# Patient Record
Sex: Male | Born: 1955 | Race: White | Hispanic: No | Marital: Single | Smoking: Current every day smoker
Health system: Southern US, Community
[De-identification: ages and names within clinical notes are randomized; demographics above are authoritative.]

## PROBLEM LIST (undated history)

## (undated) DIAGNOSIS — M549 Dorsalgia, unspecified: Secondary | ICD-10-CM

## (undated) DIAGNOSIS — N200 Calculus of kidney: Secondary | ICD-10-CM

## (undated) DIAGNOSIS — J9819 Other pulmonary collapse: Secondary | ICD-10-CM

## (undated) DIAGNOSIS — K469 Unspecified abdominal hernia without obstruction or gangrene: Secondary | ICD-10-CM

## (undated) HISTORY — PX: BACK SURGERY: SHX140

---

## 1988-11-09 DIAGNOSIS — J9819 Other pulmonary collapse: Secondary | ICD-10-CM

## 1988-11-09 HISTORY — DX: Other pulmonary collapse: J98.19

## 1999-10-22 ENCOUNTER — Emergency Department (HOSPITAL_COMMUNITY): Admission: EM | Admit: 1999-10-22 | Discharge: 1999-10-22 | Payer: Self-pay | Admitting: Emergency Medicine

## 1999-11-01 ENCOUNTER — Ambulatory Visit (HOSPITAL_COMMUNITY): Admission: RE | Admit: 1999-11-01 | Discharge: 1999-11-01 | Payer: Self-pay | Admitting: *Deleted

## 1999-11-01 ENCOUNTER — Encounter: Payer: Self-pay | Admitting: *Deleted

## 1999-11-09 ENCOUNTER — Encounter: Payer: Self-pay | Admitting: *Deleted

## 1999-11-09 ENCOUNTER — Ambulatory Visit (HOSPITAL_COMMUNITY): Admission: RE | Admit: 1999-11-09 | Discharge: 1999-11-09 | Payer: Self-pay | Admitting: *Deleted

## 1999-11-24 ENCOUNTER — Encounter: Payer: Self-pay | Admitting: Neurosurgery

## 1999-11-25 ENCOUNTER — Observation Stay (HOSPITAL_COMMUNITY): Admission: RE | Admit: 1999-11-25 | Discharge: 1999-11-26 | Payer: Self-pay | Admitting: Neurosurgery

## 1999-11-25 ENCOUNTER — Encounter: Payer: Self-pay | Admitting: Neurosurgery

## 2000-06-09 ENCOUNTER — Emergency Department (HOSPITAL_COMMUNITY): Admission: EM | Admit: 2000-06-09 | Discharge: 2000-06-09 | Payer: Self-pay | Admitting: Emergency Medicine

## 2005-05-09 ENCOUNTER — Emergency Department (HOSPITAL_COMMUNITY): Admission: EM | Admit: 2005-05-09 | Discharge: 2005-05-09 | Payer: Self-pay | Admitting: Emergency Medicine

## 2008-09-25 ENCOUNTER — Ambulatory Visit: Payer: Self-pay | Admitting: Internal Medicine

## 2009-04-08 ENCOUNTER — Emergency Department (HOSPITAL_COMMUNITY): Admission: EM | Admit: 2009-04-08 | Discharge: 2009-04-08 | Payer: Self-pay | Admitting: Emergency Medicine

## 2009-10-17 IMAGING — CT CT PELVIS W/O CM
2 of 4 series · 16 of 46 positions shown, 18 images · non-contrast
Comparison: None available.

CT ABDOMEN

CLINICAL DATA: 52-year-old male with acute onset left flank pain
radiating to the groin.  Nausea and vomiting.

CT ABDOMEN AND PELVIS WITHOUT CONTRAST
TECHNIQUE: Multidetector CT imaging of the abdomen and pelvis was
performed following the standard protocol without intravenous
contrast.

[Series 2: <(id) stone a/p >(id) · axial · 0.83mm/px · z∈[-442,+4]mm · 13 of 94 slices shown, 15 images]
[im 4/94  soft-tissue]
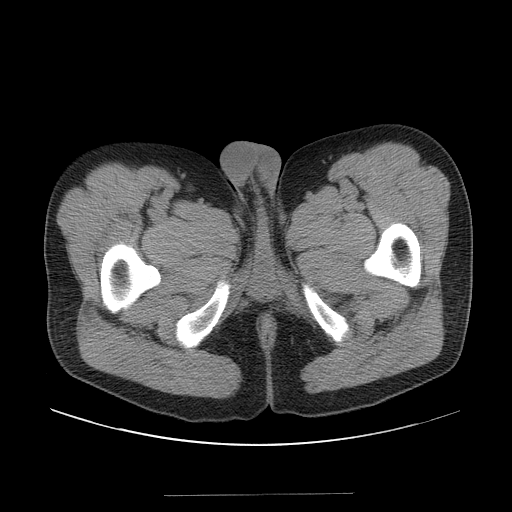
[im 4/94  bone]
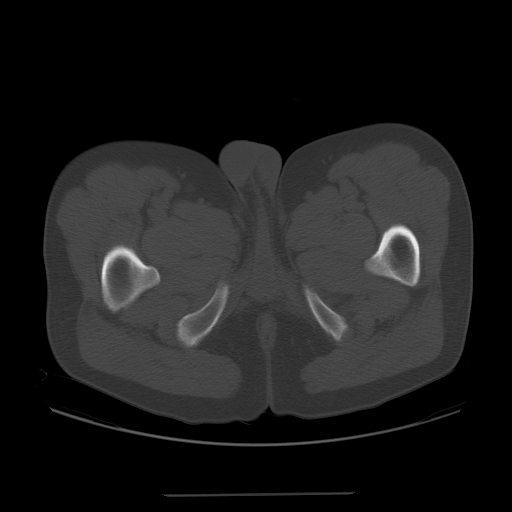
[im 12/94  soft-tissue]
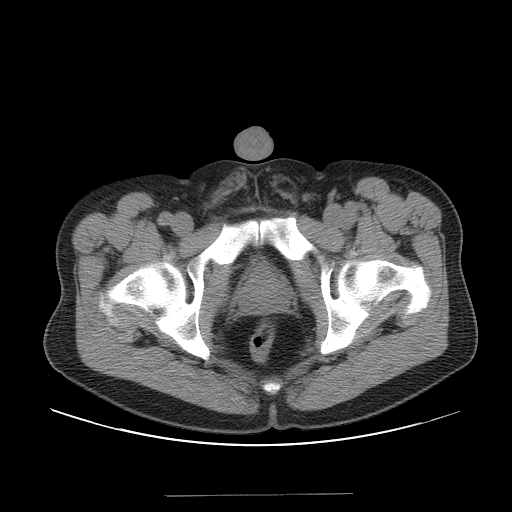
[im 20/94  soft-tissue]
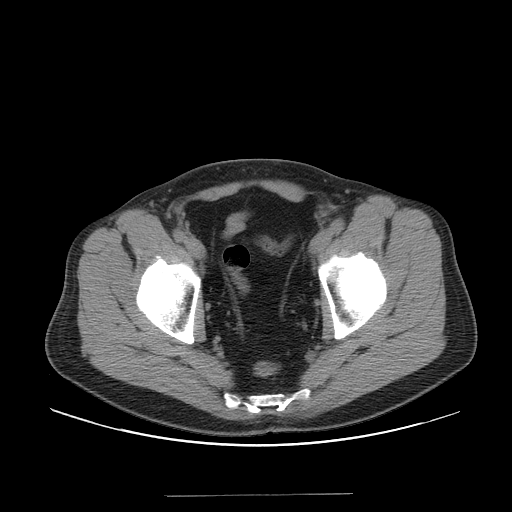
[im 28/94  soft-tissue]
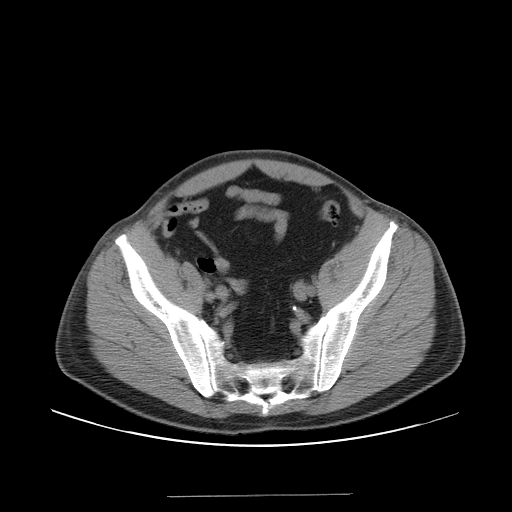
[im 32/94  soft-tissue]
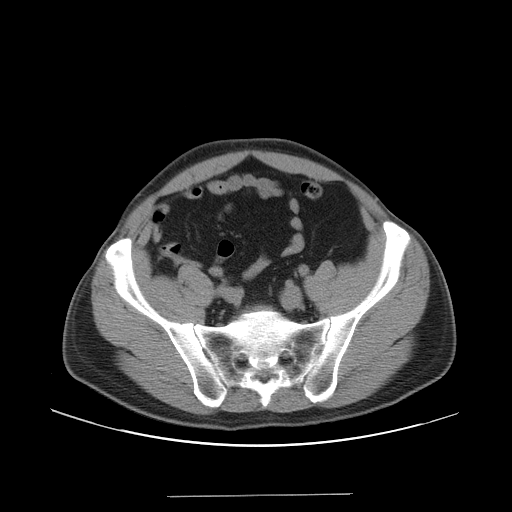
[im 39/94  soft-tissue]
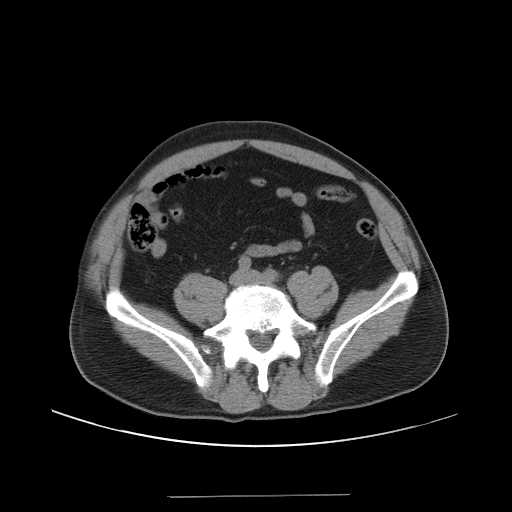
[im 47/94  soft-tissue]
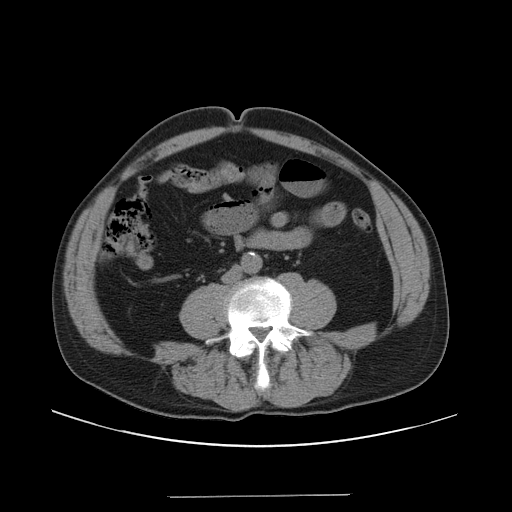
[im 55/94  soft-tissue]
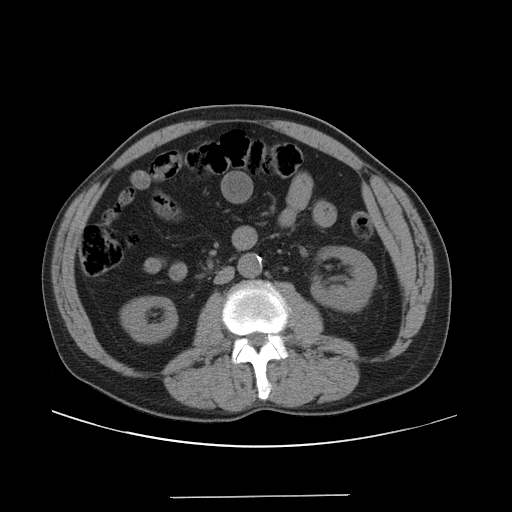
[im 63/94  soft-tissue]
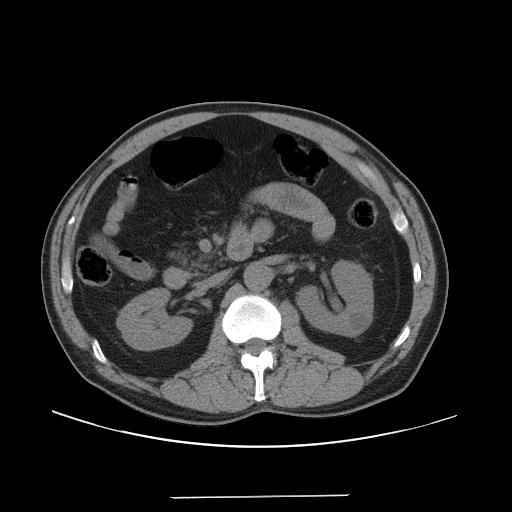
[im 63/94  bone]
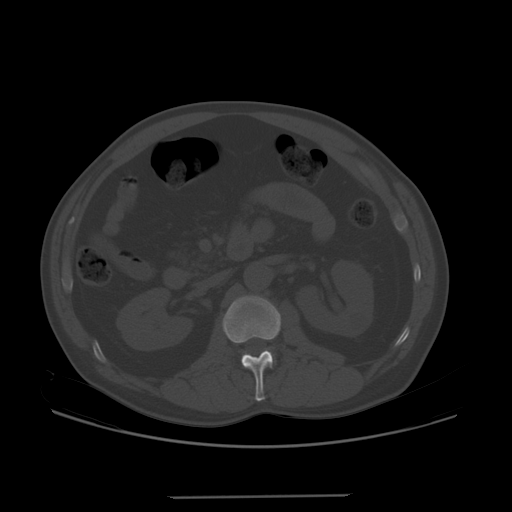
[im 66/94  soft-tissue]
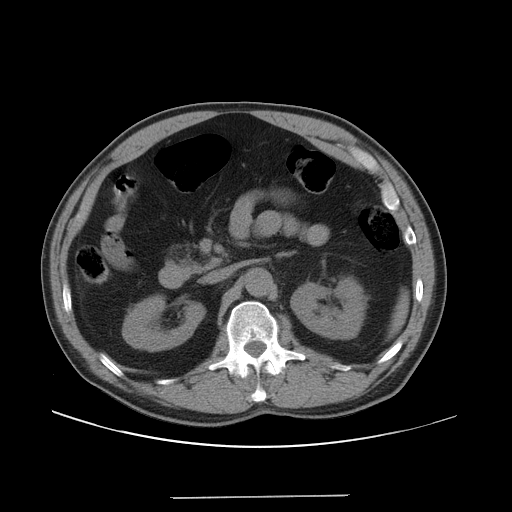
[im 74/94  soft-tissue]
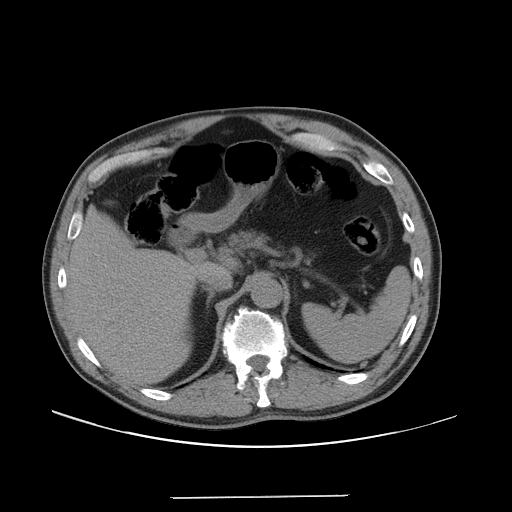
[im 82/94  soft-tissue]
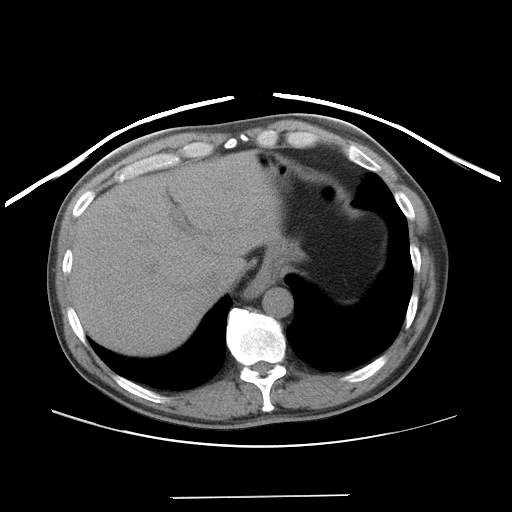
[im 90/94  soft-tissue]
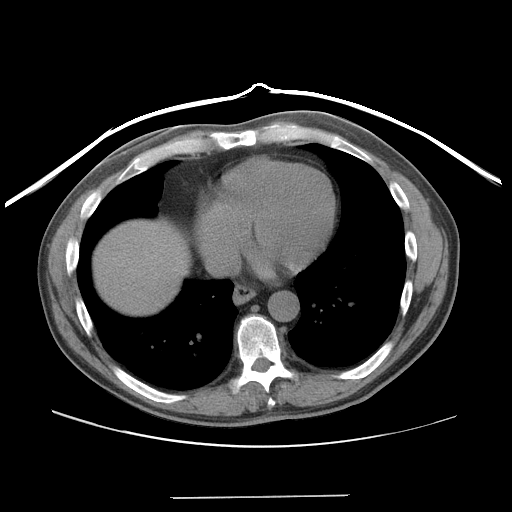

[Series 401: cor a/p · coronal · 0.90mm/px · 3 of 83 slices shown]
[im 28/83  soft-tissue]
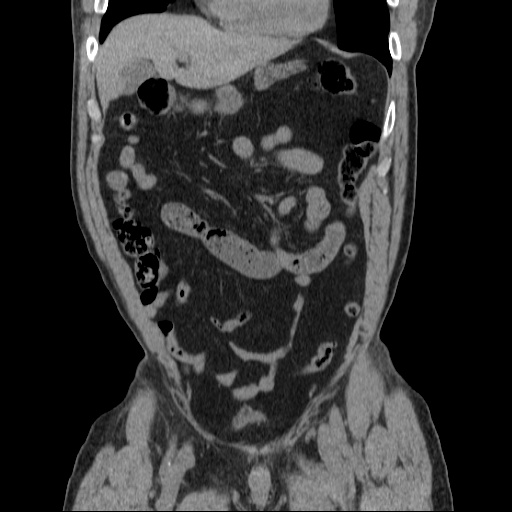
[im 37/83  soft-tissue]
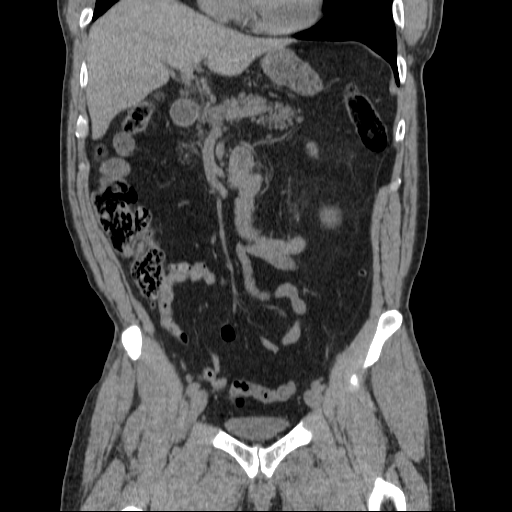
[im 46/83  soft-tissue]
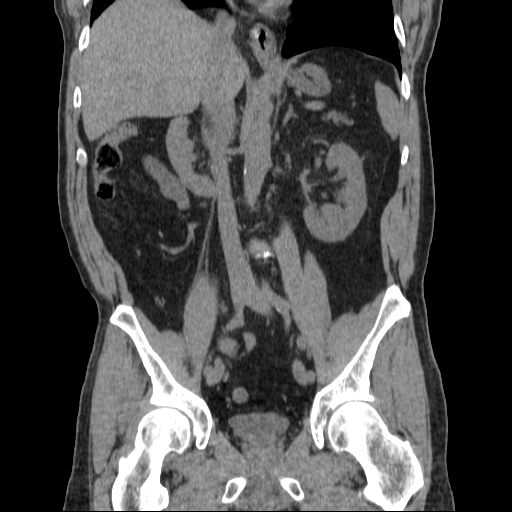

[16 of 46 positions shown; findings below may reference images not displayed]

FINDINGS: Mild atelectasis at the lung bases.  Degenerative
changes in the spine. No acute osseous abnormality identified.
Visualized noncontrast liver, gallbladder, spleen, pancreas, and
adrenal glands are within normal limits.  Small sliding-type hiatal
hernia suspected.  Otherwise, noncontrasted stomach, duodenum,
proximal small bowel loops, visualized distal small bowel loops,
and appendix are normal.  Occasional colonic diverticula, otherwise
the visualized colon is within normal limits.  No free fluid.

The right kidney and proximal right ureter are normal.

There is left nephromegaly, perinephric stranding, and moderate
left hydronephrosis.  There is a proximal obstructing left ureteral
calculus measuring 2 x 4 mm and located just distal to the left
ureteropelvic junction.  No other left nephrolithiasis.  Beyond
this level of the left ureter is decompressed and normal.  There is
a 2.5 cm diameter hypodense lesion of the left renal mid pole.
This is incompletely characterized in the absence of contrast, but
densitometry suggests a simple cyst.
IMPRESSION: 1.  Acute obstructive uropathy on the left due to a 2 x 4 mm
calculus just distal to the left UPJ.
2.  2.5 cm left renal mid pole hypodense lesion is incompletely
characterized, but a simple cyst is favored.

CT PELVIS
FINDINGS: No pelvic free fluid.  Visualized distal large and small
bowel loops are within normal limits aside from occasional colonic
diverticula.  Bladder is decompressed.  Multiple pelvic
phleboliths. No acute osseous abnormality identified.
IMPRESSION: No acute findings in the pelvis.

## 2011-02-17 LAB — URINALYSIS, ROUTINE W REFLEX MICROSCOPIC
Glucose, UA: NEGATIVE mg/dL
Ketones, ur: NEGATIVE mg/dL
Protein, ur: NEGATIVE mg/dL
pH: 6.5 (ref 5.0–8.0)

## 2011-02-17 LAB — URINE MICROSCOPIC-ADD ON
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
## 2013-11-09 DIAGNOSIS — K4090 Unilateral inguinal hernia, without obstruction or gangrene, not specified as recurrent: Secondary | ICD-10-CM

## 2013-11-09 HISTORY — DX: Unilateral inguinal hernia, without obstruction or gangrene, not specified as recurrent: K40.90

## 2013-11-13 ENCOUNTER — Encounter (HOSPITAL_COMMUNITY): Payer: Self-pay | Admitting: Emergency Medicine

## 2013-11-13 ENCOUNTER — Emergency Department (HOSPITAL_COMMUNITY): Payer: Self-pay

## 2013-11-13 ENCOUNTER — Emergency Department (HOSPITAL_COMMUNITY)
Admission: EM | Admit: 2013-11-13 | Discharge: 2013-11-13 | Disposition: A | Discharge status: Home / Self Care | Payer: Self-pay | Attending: Emergency Medicine | Admitting: Emergency Medicine

## 2013-11-13 DIAGNOSIS — Z8719 Personal history of other diseases of the digestive system: Secondary | ICD-10-CM | POA: Insufficient documentation

## 2013-11-13 DIAGNOSIS — R0789 Other chest pain: Secondary | ICD-10-CM

## 2013-11-13 DIAGNOSIS — F17200 Nicotine dependence, unspecified, uncomplicated: Secondary | ICD-10-CM | POA: Insufficient documentation

## 2013-11-13 DIAGNOSIS — Z79899 Other long term (current) drug therapy: Secondary | ICD-10-CM | POA: Insufficient documentation

## 2013-11-13 DIAGNOSIS — R071 Chest pain on breathing: Secondary | ICD-10-CM | POA: Insufficient documentation

## 2013-11-13 DIAGNOSIS — Z8709 Personal history of other diseases of the respiratory system: Secondary | ICD-10-CM | POA: Insufficient documentation

## 2013-11-13 DIAGNOSIS — IMO0001 Reserved for inherently not codable concepts without codable children: Secondary | ICD-10-CM | POA: Insufficient documentation

## 2013-11-13 HISTORY — DX: Unspecified abdominal hernia without obstruction or gangrene: K46.9

## 2013-11-13 HISTORY — DX: Other pulmonary collapse: J98.19

## 2013-11-13 LAB — URINALYSIS W MICROSCOPIC + REFLEX CULTURE
Bilirubin Urine: NEGATIVE
Glucose, UA: NEGATIVE mg/dL
Hgb urine dipstick: NEGATIVE
Ketones, ur: NEGATIVE mg/dL
LEUKOCYTES UA: NEGATIVE
NITRITE: NEGATIVE
PH: 7 (ref 5.0–8.0)
Protein, ur: NEGATIVE mg/dL
SPECIFIC GRAVITY, URINE: 1.012 (ref 1.005–1.030)
UROBILINOGEN UA: 0.2 mg/dL (ref 0.0–1.0)

## 2013-11-13 LAB — COMPREHENSIVE METABOLIC PANEL
ALT: 12 U/L (ref 0–53)
AST: 18 U/L (ref 0–37)
Albumin: 3.6 g/dL (ref 3.5–5.2)
Alkaline Phosphatase: 51 U/L (ref 39–117)
BUN: 11 mg/dL (ref 6–23)
CALCIUM: 9.4 mg/dL (ref 8.4–10.5)
CO2: 26 mEq/L (ref 19–32)
CREATININE: 0.73 mg/dL (ref 0.50–1.35)
Chloride: 96 mEq/L (ref 96–112)
GFR calc non Af Amer: >90 mL/min (ref >90)
GLUCOSE: 91 mg/dL (ref 70–99)
Potassium: 4.2 mEq/L (ref 3.7–5.3)
SODIUM: 134 meq/L — AB (ref 137–147)
TOTAL PROTEIN: 7.1 g/dL (ref 6.0–8.3)
Total Bilirubin: 0.6 mg/dL (ref 0.3–1.2)

## 2013-11-13 LAB — D-DIMER, QUANTITATIVE: D-Dimer, Quant: <0.27 ug/mL-FEU (ref 0.00–0.48)

## 2013-11-13 LAB — POCT I-STAT TROPONIN I: Troponin i, poc: 0.01 ng/mL (ref 0.00–0.08)

## 2013-11-13 MED ORDER — IBUPROFEN 600 MG PO TABS: 600.0000 mg | ORAL_TABLET | Freq: Four times a day (QID) | ORAL | Status: AC | PRN

## 2013-11-13 MED ORDER — HYDROCODONE-ACETAMINOPHEN 5-325 MG PO TABS
1.0000 | ORAL_TABLET | Freq: Once | ORAL | Status: AC
  Administered 2013-11-13: 1 via ORAL
  Filled 2013-11-13: qty 1

## 2013-11-13 MED ORDER — HYDROCODONE-ACETAMINOPHEN 5-325 MG PO TABS: 1.0000 | ORAL_TABLET | Freq: Four times a day (QID) | ORAL | Status: AC | PRN

## 2013-11-13 MED ORDER — DIAZEPAM 5 MG PO TABS
5.0000 mg | ORAL_TABLET | Freq: Once | ORAL | Status: AC
  Administered 2013-11-13: 5 mg via ORAL
  Filled 2013-11-13: qty 1

## 2013-11-13 MED ORDER — DIAZEPAM 5 MG PO TABS: 5.0000 mg | ORAL_TABLET | Freq: Three times a day (TID) | ORAL | Status: AC | PRN

## 2013-11-13 NOTE — Discharge Instructions (Signed)
Your work up today did not show any problems with your heart or lungs. It is most likely musculoskeletal. Take ibuprofen for pain. Take valium for spams, norco for severe pain. Rest. Try heating pads. Follow up with your doctor.    Chest Wall Pain Chest wall pain is pain in or around the bones and muscles of your chest. It may take up to 6 weeks to get better. It may take longer if you must stay physically active in your work and activities.  CAUSES  Chest wall pain may happen on its own. However, it may be caused by:  A viral illness like the flu.  Injury.  Coughing.  Exercise.  Arthritis.  Fibromyalgia.  Shingles. HOME CARE INSTRUCTIONS   Avoid overtiring physical activity. Try not to strain or perform activities that cause pain. This includes any activities using your chest or your abdominal and side muscles, especially if heavy weights are used.  Put ice on the sore area.  Put ice in a plastic bag.  Place a towel between your skin and the bag.  Leave the ice on for 15-20 minutes per hour while awake for the first 2 days.  Only take over-the-counter or prescription medicines for pain, discomfort, or fever as directed by your caregiver. SEEK IMMEDIATE MEDICAL CARE IF:   Your pain increases, or you are very uncomfortable.  You have a fever.  Your chest pain becomes worse.  You have new, unexplained symptoms.  You have nausea or vomiting.  You feel sweaty or lightheaded.  You have a cough with phlegm (sputum), or you cough up blood. MAKE SURE YOU:   Understand these instructions.  Will watch your condition.  Will get help right away if you are not doing well or get worse. Document Released: 10/26/2005 Document Revised: 01/18/2012 Document Reviewed: 06/22/2011 [facility] Patient Information 2014 [place], [place].

## 2013-11-13 NOTE — ED Notes (Signed)
Pt c/o rt sided chest pain radiating through rt shoulder/rt neck since last pm, states hx of collapsed lung and this feels the same

## 2013-11-13 NOTE — Progress Notes (Signed)
[facility] provided pt with a list of primary care resources. Patient stated that he was pending [employer] insurance on 2/1.

## 2013-11-13 NOTE — ED Provider Notes (Signed)
CSN: [id number]     Arrival date & time 11/13/13  1531 History   First MD Initiated Contact with Patient 11/13/13 1833     Chief Complaint  Patient presents with  . Chest Pain  . Shortness of Breath   (Consider location/radiation/quality/duration/timing/severity/associated sxs/prior Treatment) HPI Cole Coleman is a 58 y.o. male who presents emergency department complaining of right chest pain and right shoulder pain. Patient states that his pain began 3 days ago. Patient states that his pain is on the right side, feels like deepened along, states worsened with movement of the body and bowel movement of the right arm. He denies any pain with deep breathing. He denies any cough or pain with coughing. He denies any shortness of breath. He denies any fever or chills. Denies any injuries. He states he has history of collapsed lung and states this feels the same. Patient took ibuprofen today for his pain which didn't help his symptoms but states his pain is still 8/10.  Past Medical History  Diagnosis Date  . Collapsed lung   . Hernia    Past Surgical History  Procedure Laterality Date  . Back surgery     No family history on file. History  Substance Use Topics  . Smoking status: Current Every Day Smoker  . Smokeless tobacco: Not on file  . Alcohol Use: No    Review of Systems  Constitutional: Negative for fever and chills.  Respiratory: Negative for cough, chest tightness and shortness of breath.   Cardiovascular: Positive for chest pain. Negative for palpitations and leg swelling.  Gastrointestinal: Negative for nausea, vomiting, abdominal pain, diarrhea and abdominal distention.  Genitourinary: Negative for dysuria, urgency, frequency and hematuria.  Musculoskeletal: Positive for myalgias. Negative for neck pain and neck stiffness.  Skin: Negative for rash.  Allergic/Immunologic: Negative for immunocompromised state.  Neurological: Negative for dizziness, weakness and numbness.     Allergies  Codeine  Home Medications   Current Outpatient Rx  Name  Route  Sig  Dispense  Refill  . ibuprofen (ADVIL,MOTRIN) 200 MG tablet   Oral   Take 400 mg by mouth every 6 (six) hours as needed.         . meloxicam (MOBIC) 15 MG tablet   Oral   Take 15 mg by mouth daily.          BP 117/66  Pulse 82  Temp(Src) 98.6 F (37 C) (Oral)  Resp 16  SpO2 97% Physical Exam  Nursing note and vitals reviewed. Constitutional: He appears well-developed and well-nourished. No distress.  HENT:  Head: Normocephalic.  Eyes: Conjunctivae are normal.  Neck: Normal range of motion. Neck supple.  Cardiovascular: Normal rate, regular rhythm and normal heart sounds.   Pulmonary/Chest: Effort normal and breath sounds normal. No respiratory distress. He has no wheezes. He has no rales. He exhibits tenderness.  Tender over anterior pectoralis muscle, mid axillary ribs, posterior periscapular muscles.   Abdominal: Soft. Bowel sounds are normal. There is no tenderness.  Musculoskeletal: He exhibits no edema.  Neurological: He is alert.  Skin: Skin is warm and dry.    ED Course  Procedures (including critical care time) Labs Review Labs Reviewed  CBC WITH DIFFERENTIAL - Abnormal; Notable for the following:    WBC 12.4 (*)    All other components within normal limits  COMPREHENSIVE METABOLIC PANEL - Abnormal; Notable for the following:    Sodium 134 (*)    All other components within normal limits  URINALYSIS W  MICROSCOPIC + REFLEX CULTURE  D-DIMER, QUANTITATIVE  POCT I-STAT TROPONIN I   Imaging Review Dg Chest 2 View (if Patient Has Fever And/or Copd)  11/13/2013   CLINICAL DATA:  Shortness of breath.  Right side chest pain.  EXAM: CHEST  2 VIEW  COMPARISON:  None.  FINDINGS: The lungs are clear. Heart size is normal. No pneumothorax or pleural effusion. No focal bony abnormality is identified.  IMPRESSION: No acute disease.   Electronically Signed   By: Drusilla Kanner M.D.    On: 11/13/2013 17:28    EKG Interpretation    Date/Time:  Monday November 13 2013 15:52:15 EST Ventricular Rate:  80 PR Interval:  128 QRS Duration: 115 QT Interval:  364 QTC Calculation: 420 R Axis:   74 Text Interpretation:  Sinus rhythm Ventricular premature complex Nonspecific intraventricular conduction delay No significant change since last tracing Confirmed by HARRISON  MD, FORREST ([id number]) on 11/13/2013 9:59:04 PM            MDM   1. Chest wall pain    and  Patient with right sided chest and shoulder pain onset yesterday. Pain is reproducible with movement of the right arm and palpation of the right side. There is no rash over the area. Chest x-Lonigro and labs are unremarkable. EKG is normal. Given findings, history, exam, suspect chest wall pain. Patient does lift heavy loads at work. I discussed with Dr. Romeo Apple who has seen patient and increase. Will discharge home with muscle relaxants and pain medication. Followup with primary care Dr. Gaylyn Rong signs are completely normal. Patient is nontoxic and is ready for discharge home.  Filed Vitals:   11/13/13 1545 11/13/13 2251  BP: 117/66 123/76  Pulse: 82 71  Temp: 98.6 F (37 C)   TempSrc: Oral   Resp: 16 16  SpO2: 97% 98%     Cole Judice A Toula Miyasaki, PA-C 11/14/13 0200

## 2013-11-14 LAB — CBC WITH DIFFERENTIAL/PLATELET
BASOS PCT: 0 % (ref 0–1)
Basophils Absolute: 0 10*3/uL (ref 0.0–0.1)
EOS ABS: 0.1 10*3/uL (ref 0.0–0.7)
Eosinophils Relative: 1 % (ref 0–5)
HCT: 43.8 % (ref 39.0–52.0)
HEMOGLOBIN: 14.7 g/dL (ref 13.0–17.0)
LYMPHS ABS: 2.7 10*3/uL (ref 0.7–4.0)
Lymphocytes Relative: 22 % (ref 12–46)
MCH: 30.6 pg (ref 26.0–34.0)
MCHC: 33.6 g/dL (ref 30.0–36.0)
MCV: 91.3 fL (ref 78.0–100.0)
MONO ABS: 0.9 10*3/uL (ref 0.1–1.0)
Monocytes Relative: 7 % (ref 3–12)
NEUTROS ABS: 8.7 10*3/uL — AB (ref 1.7–7.7)
NEUTROS PCT: 70 % (ref 43–77)
Platelets: 260 10*3/uL (ref 150–400)
RBC: 4.8 MIL/uL (ref 4.22–5.81)
RDW: 13 % (ref 11.5–15.5)
WBC: 12.4 10*3/uL — ABNORMAL HIGH (ref 4.0–10.5)

## 2013-11-14 NOTE — ED Provider Notes (Signed)
Medical screening examination/treatment/procedure(s) were conducted as a shared visit with non-physician practitioner(s) and myself.  I personally evaluated the patient during the encounter.  EKG Interpretation    Date/Time:  Monday November 13 2013 15:52:15 EST Ventricular Rate:  80 PR Interval:  128 QRS Duration: 115 QT Interval:  364 QTC Calculation: 420 R Axis:   74 Text Interpretation:  Sinus rhythm Ventricular premature complex Nonspecific intraventricular conduction delay No significant change since last tracing Confirmed by Tenessa Marsee  MD, Wasim Hurlbut ([id number]) on 11/13/2013 9:59:04 PM           I interviewed and examined the patient. Lungs are CTAB. Cardiac exam wnl. Abdomen soft. Pain w/ active rom of right shoulder. Pain easily reproduced on exam. Unlikely a cardiac source given good history for shoulder strain. Pt feeling better w/ norco. Labs/imaging otherwise non-contrib. Will rec d/c home and f/u w/ ortho if sx persist.   Junius ArgyleForrest S Aquarius Latouche, MD 11/14/13 1143

## 2014-05-24 IMAGING — CR DG CHEST 2V
2 series · 2 of 2 positions shown · non-contrast
Comparison: None.

CLINICAL DATA: Shortness of breath.  Right side chest pain.

EXAM:
CHEST  2 VIEW

[w chest pa]
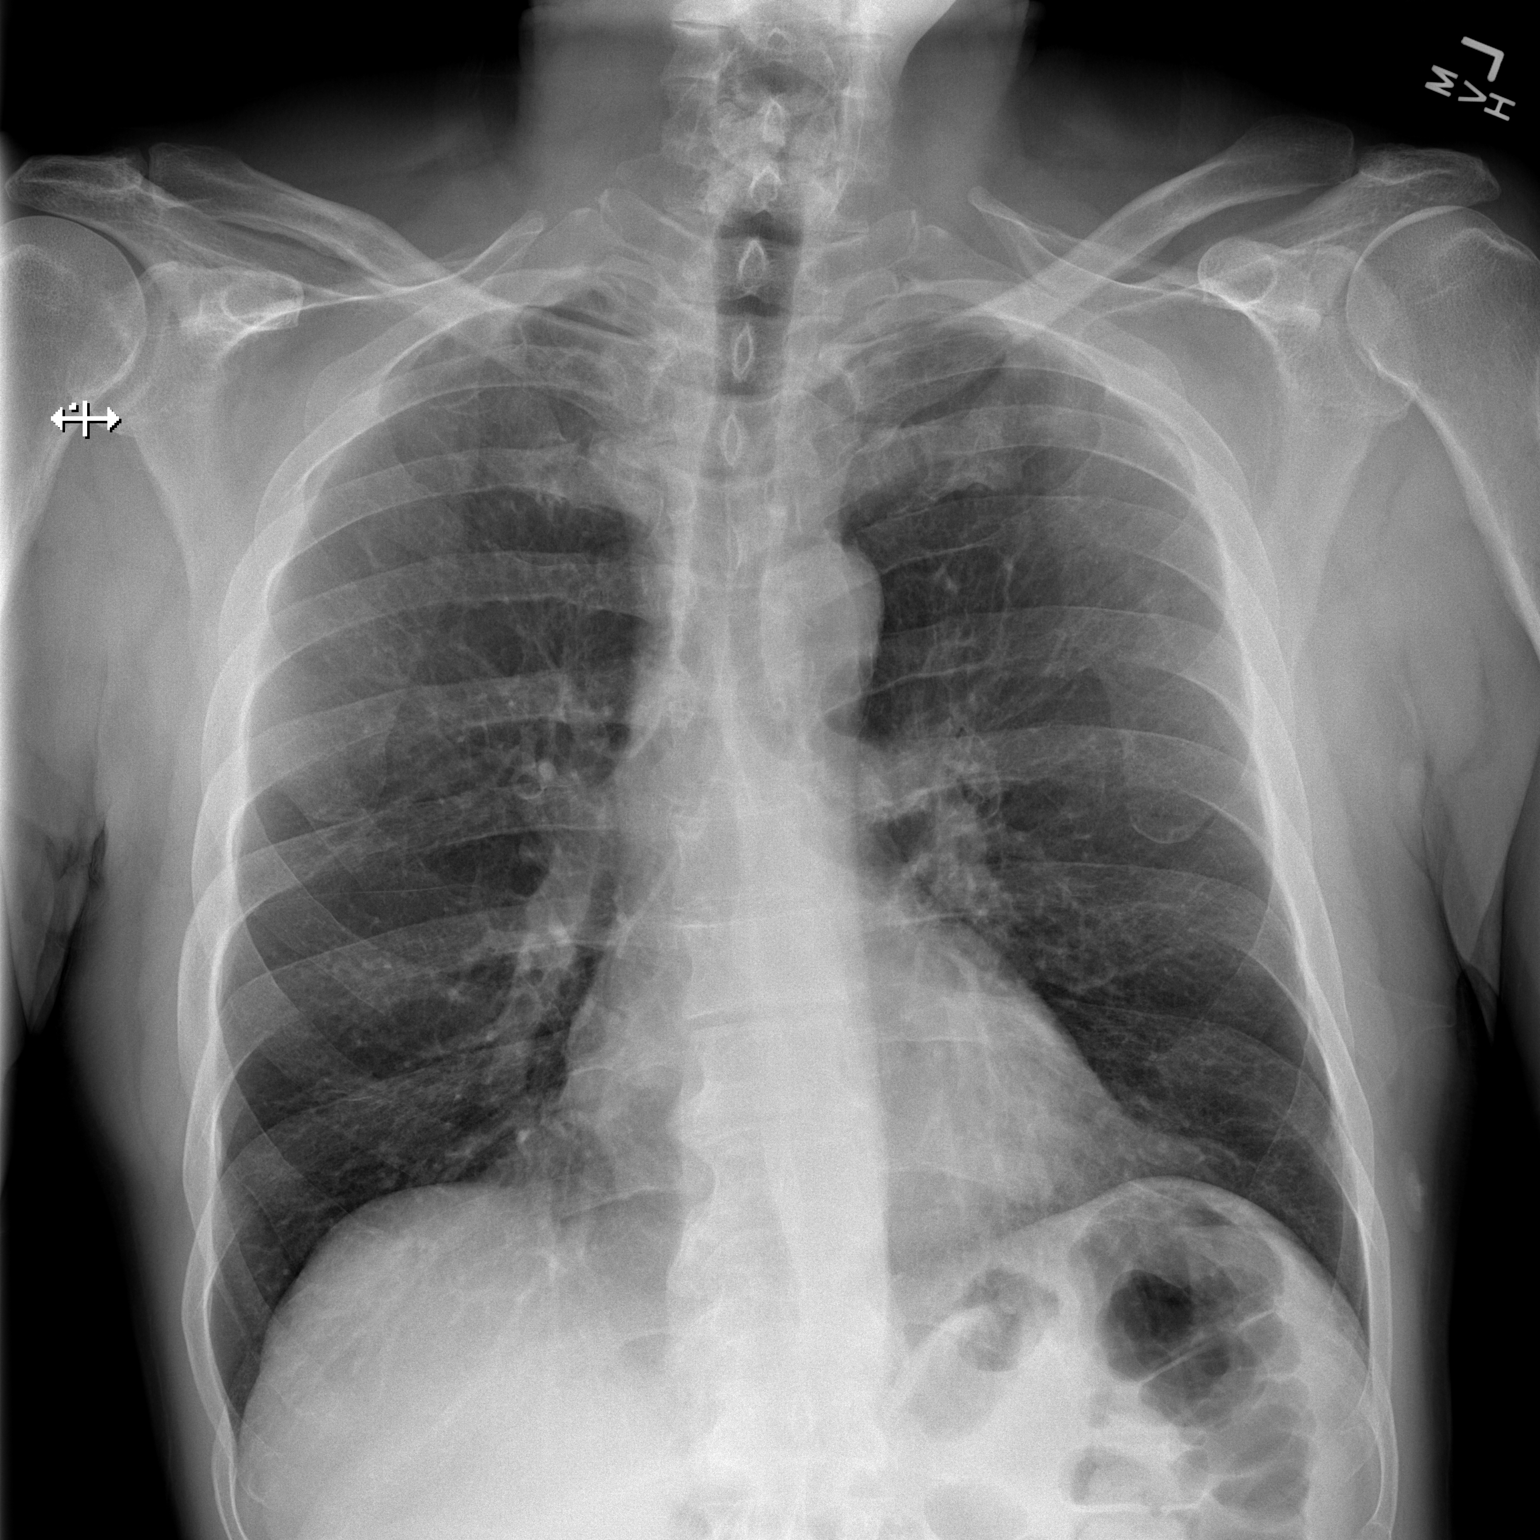

[w chest lat]
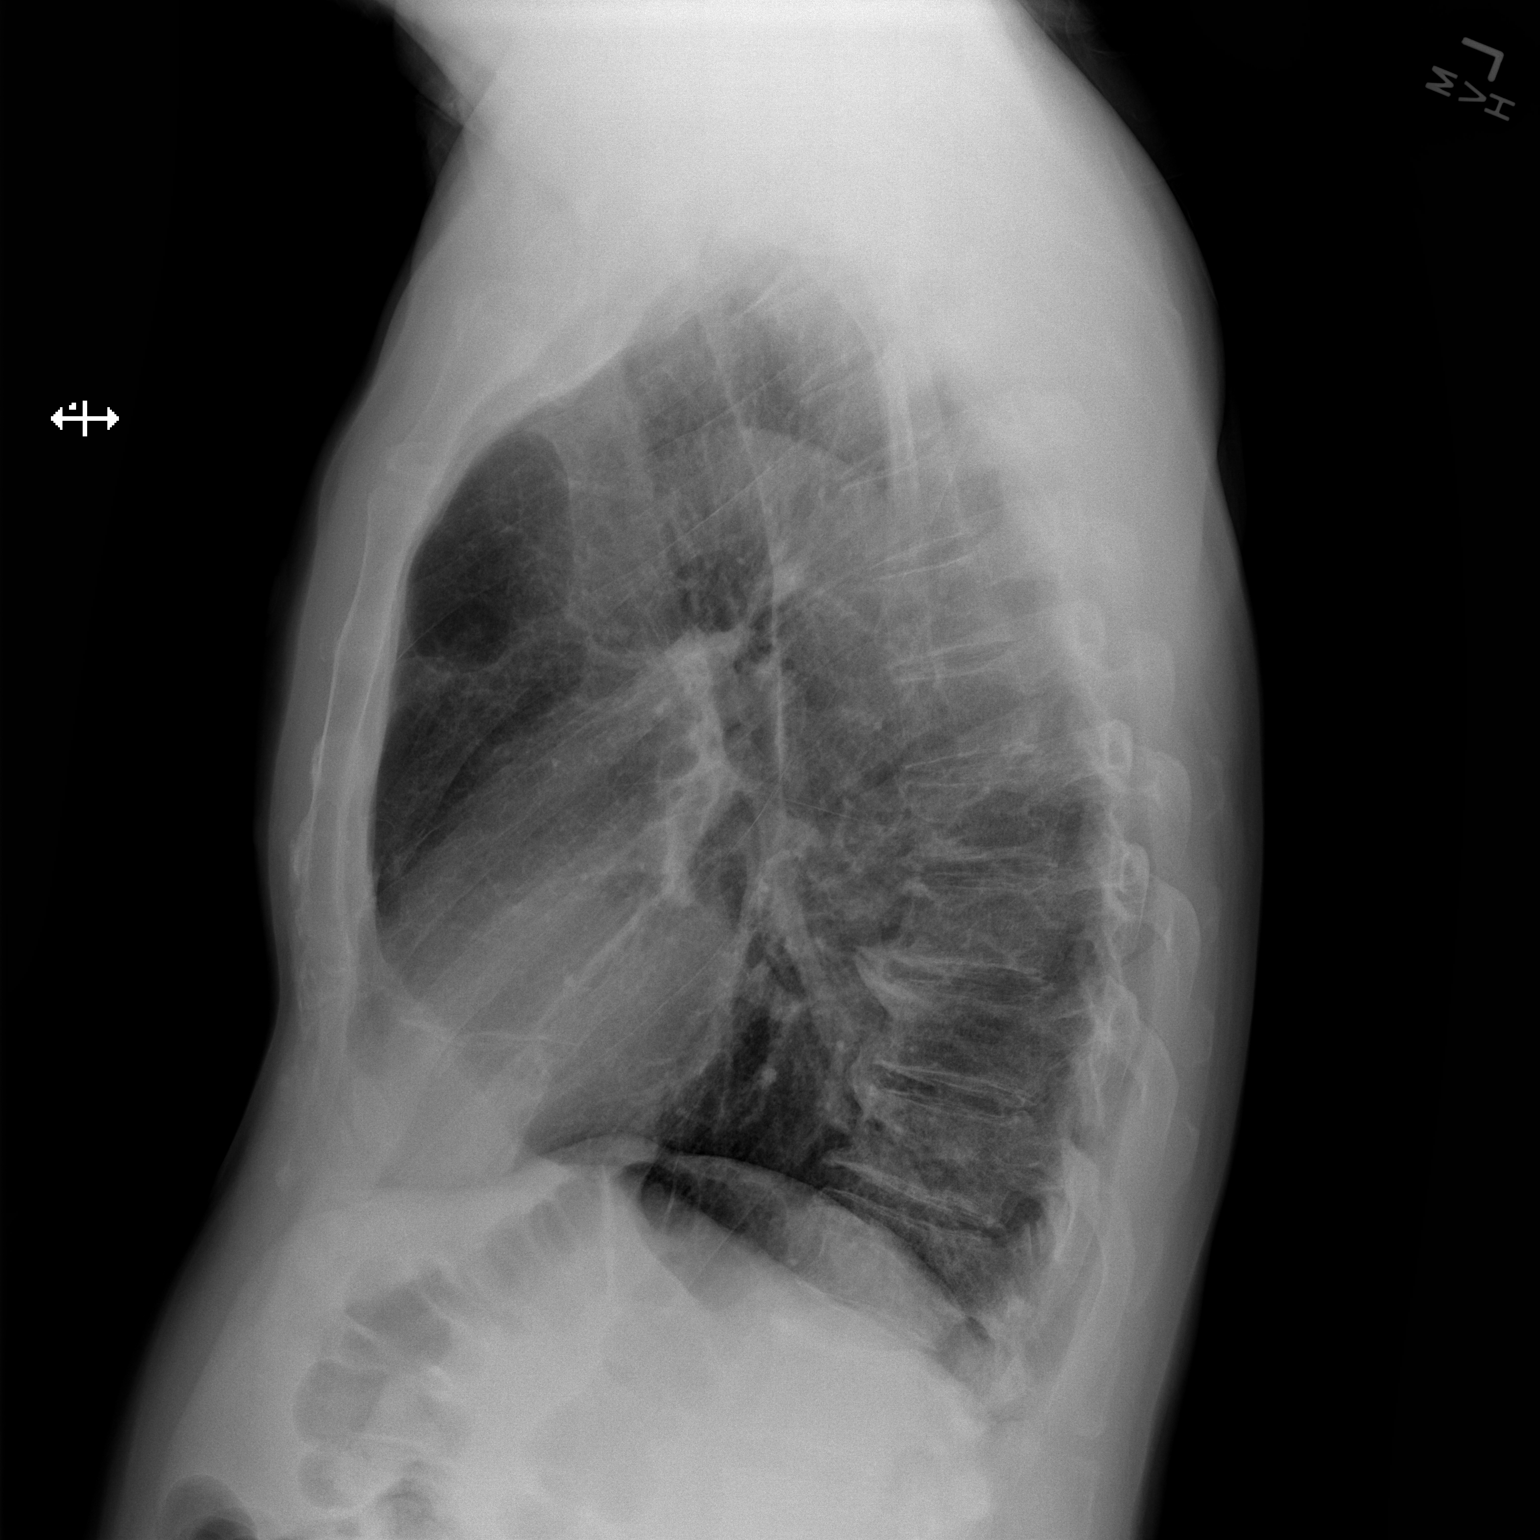

[2 of 2 positions shown; findings below may reference images not displayed]

FINDINGS: The lungs are clear. Heart size is normal. No pneumothorax or
pleural effusion. No focal bony abnormality is identified.
IMPRESSION: No acute disease.

## 2015-01-07 ENCOUNTER — Encounter (HOSPITAL_COMMUNITY): Payer: Self-pay | Admitting: Emergency Medicine

## 2015-01-07 ENCOUNTER — Emergency Department (HOSPITAL_COMMUNITY): Payer: Self-pay

## 2015-01-07 ENCOUNTER — Emergency Department (HOSPITAL_COMMUNITY)
Admission: EM | Admit: 2015-01-07 | Discharge: 2015-01-08 | Disposition: A | Discharge status: Home / Self Care | Payer: Self-pay | Attending: Emergency Medicine | Admitting: Emergency Medicine

## 2015-01-07 DIAGNOSIS — Z79899 Other long term (current) drug therapy: Secondary | ICD-10-CM | POA: Insufficient documentation

## 2015-01-07 DIAGNOSIS — Z87442 Personal history of urinary calculi: Secondary | ICD-10-CM

## 2015-01-07 DIAGNOSIS — Z791 Long term (current) use of non-steroidal anti-inflammatories (NSAID): Secondary | ICD-10-CM | POA: Insufficient documentation

## 2015-01-07 DIAGNOSIS — N200 Calculus of kidney: Secondary | ICD-10-CM | POA: Insufficient documentation

## 2015-01-07 DIAGNOSIS — Z87891 Personal history of nicotine dependence: Secondary | ICD-10-CM | POA: Insufficient documentation

## 2015-01-07 DIAGNOSIS — Z8719 Personal history of other diseases of the digestive system: Secondary | ICD-10-CM | POA: Insufficient documentation

## 2015-01-07 DIAGNOSIS — Z8709 Personal history of other diseases of the respiratory system: Secondary | ICD-10-CM | POA: Insufficient documentation

## 2015-01-07 HISTORY — DX: Calculus of kidney: N20.0

## 2015-01-07 LAB — COMPREHENSIVE METABOLIC PANEL
ALT: 18 U/L (ref 0–53)
AST: 27 U/L (ref 0–37)
Albumin: 4.3 g/dL (ref 3.5–5.2)
Alkaline Phosphatase: 49 U/L (ref 39–117)
Anion gap: 10 (ref 5–15)
BILIRUBIN TOTAL: 1 mg/dL (ref 0.3–1.2)
BUN: 16 mg/dL (ref 6–23)
CHLORIDE: 100 mmol/L (ref 96–112)
CO2: 25 mmol/L (ref 19–32)
Calcium: 9 mg/dL (ref 8.4–10.5)
Creatinine, Ser: 0.86 mg/dL (ref 0.50–1.35)
GFR calc Af Amer: >90 mL/min (ref >90)
Glucose, Bld: 118 mg/dL — ABNORMAL HIGH (ref 70–99)
POTASSIUM: 3.5 mmol/L (ref 3.5–5.1)
SODIUM: 135 mmol/L (ref 135–145)
Total Protein: 7.4 g/dL (ref 6.0–8.3)

## 2015-01-07 LAB — CBC WITH DIFFERENTIAL/PLATELET
BASOS ABS: 0 10*3/uL (ref 0.0–0.1)
Basophils Relative: 0 % (ref 0–1)
Eosinophils Absolute: 0 10*3/uL (ref 0.0–0.7)
Eosinophils Relative: 0 % (ref 0–5)
HEMATOCRIT: 42.4 % (ref 39.0–52.0)
HEMOGLOBIN: 14.1 g/dL (ref 13.0–17.0)
LYMPHS ABS: 1.4 10*3/uL (ref 0.7–4.0)
LYMPHS PCT: 11 % — AB (ref 12–46)
MCH: 30.1 pg (ref 26.0–34.0)
MCHC: 33.3 g/dL (ref 30.0–36.0)
MCV: 90.4 fL (ref 78.0–100.0)
MONO ABS: 0.6 10*3/uL (ref 0.1–1.0)
MONOS PCT: 5 % (ref 3–12)
NEUTROS ABS: 11.1 10*3/uL — AB (ref 1.7–7.7)
Neutrophils Relative %: 84 % — ABNORMAL HIGH (ref 43–77)
Platelets: 243 10*3/uL (ref 150–400)
RBC: 4.69 MIL/uL (ref 4.22–5.81)
RDW: 12.7 % (ref 11.5–15.5)
WBC: 13.1 10*3/uL — AB (ref 4.0–10.5)

## 2015-01-07 LAB — URINALYSIS, ROUTINE W REFLEX MICROSCOPIC
BILIRUBIN URINE: NEGATIVE
Glucose, UA: NEGATIVE mg/dL
KETONES UR: 15 mg/dL — AB
Leukocytes, UA: NEGATIVE
NITRITE: NEGATIVE
PROTEIN: NEGATIVE mg/dL
Specific Gravity, Urine: 1.015 (ref 1.005–1.030)
Urobilinogen, UA: 1 mg/dL (ref 0.0–1.0)
pH: 8 (ref 5.0–8.0)

## 2015-01-07 LAB — URINE MICROSCOPIC-ADD ON

## 2015-01-07 MED ORDER — ONDANSETRON HCL 4 MG/2ML IJ SOLN
4.0000 mg | Freq: Once | INTRAMUSCULAR | Status: AC
  Administered 2015-01-07: 4 mg via INTRAVENOUS
  Filled 2015-01-07: qty 2

## 2015-01-07 MED ORDER — SODIUM CHLORIDE 0.9 % IV BOLUS (SEPSIS)
500.0000 mL | Freq: Once | INTRAVENOUS | Status: AC
Start: 2015-01-07 — End: 2015-01-07
  Administered 2015-01-07: 500 mL via INTRAVENOUS

## 2015-01-07 MED ORDER — HYDROMORPHONE HCL 1 MG/ML IJ SOLN
1.0000 mg | Freq: Once | INTRAMUSCULAR | Status: AC
  Administered 2015-01-07: 1 mg via INTRAVENOUS
  Filled 2015-01-07: qty 1

## 2015-01-07 NOTE — ED Notes (Signed)
Patient aware ua sample is needed. Unable to provide at this time. Urinal @ bedside.

## 2015-01-07 NOTE — ED Provider Notes (Signed)
CSN: [id number]     Arrival date & time 01/07/15  2035 History   First MD Initiated Contact with Patient 01/07/15 2110     Chief Complaint  Patient presents with  . Flank Pain     (Consider location/radiation/quality/duration/timing/severity/associated sxs/prior Treatment) HPI   PCP: Default, Provider, MD Blood pressure 105/67, pulse 70, temperature 98.4 F (36.9 C), temperature source Oral, resp. rate 18, SpO2 95 %.  Cole Coleman is a 59 y.o.male with a significant PMH of collapsed lung, hernia, kidney stones presents to the ER with complaints of acute onset of right flank pain at 6:30 pm that radiating down into his right testicle. This feels like his previous kidney stones but not as severe.The pain was so severe he had multiple episodes of vomiting and nausea.  Negative Review of Symptoms:   He denies dysuria or hematuria. He denies chest pain, SOB, diarrhea, fevers, weakness.   Past Medical History  Diagnosis Date  . Collapsed lung   . Hernia   . Kidney stones    Past Surgical History  Procedure Laterality Date  . Back surgery     History reviewed. No pertinent family history. History  Substance Use Topics  . Smoking status: Former Games developermoker  . Smokeless tobacco: Not on file  . Alcohol Use: No    Review of Systems  10 Systems reviewed and are negative for acute change except as noted in the HPI.   Allergies  Codeine  Home Medications   Prior to Admission medications   Medication Sig Start Date End Date Taking? Authorizing Provider  Multiple Vitamin (MULTIVITAMIN WITH MINERALS) TABS tablet Take 1 tablet by mouth daily.   Yes Historical Provider, MD  omega-3 acid ethyl esters (LOVAZA) 1 G capsule Take 1 g by mouth 2 (two) times daily.   Yes Historical Provider, MD  vitamin C (ASCORBIC ACID) 500 MG tablet Take 500 mg by mouth daily.   Yes Historical Provider, MD  diazepam (VALIUM) 5 MG tablet Take 1 tablet (5 mg total) by mouth every 8 (eight) hours as needed for  anxiety. Patient not taking: Reported on 01/07/2015 11/13/13   Tatyana A Kirichenko, PA-C  HYDROcodone-acetaminophen (NORCO) 5-325 MG per tablet Take 1 tablet by mouth every 6 (six) hours as needed. Patient not taking: Reported on 01/07/2015 11/13/13   Tatyana A Kirichenko, PA-C  ibuprofen (ADVIL,MOTRIN) 600 MG tablet Take 1 tablet (600 mg total) by mouth every 6 (six) hours as needed. Patient not taking: Reported on 01/07/2015 11/13/13   Tatyana A Kirichenko, PA-C  ondansetron (ZOFRAN) 4 MG tablet Take 1 tablet (4 mg total) by mouth every 6 (six) hours. 01/08/15   Monti Jilek Irine SealG Raynor Calcaterra, PA-C  oxyCODONE-acetaminophen (PERCOCET/ROXICET) 5-325 MG per tablet Take 1-2 tablets by mouth every 6 (six) hours as needed for severe pain. 01/08/15   Arrie Borrelli Irine SealG Shanekqua Schaper, PA-C   BP 108/67 mmHg  Pulse 66  Temp(Src) 98.4 F (36.9 C) (Oral)  Resp 16  SpO2 93% Physical Exam  Constitutional: He appears well-developed and well-nourished. No distress.  HENT:  Head: Normocephalic and atraumatic.  Eyes: Pupils are equal, round, and reactive to light.  Neck: Normal range of motion. Neck supple.  Cardiovascular: Normal rate and regular rhythm.   Pulmonary/Chest: Effort normal.  Abdominal: Soft. Bowel sounds are normal. He exhibits no distension. There is tenderness. There is CVA tenderness (right). There is no rigidity, no rebound and no guarding.  Neurological: He is alert.  Skin: Skin is warm and dry.  Nursing note  and vitals reviewed.   ED Course  Procedures (including critical care time) Labs Review Labs Reviewed  CBC WITH DIFFERENTIAL/PLATELET - Abnormal; Notable for the following:    WBC 13.1 (*)    Neutrophils Relative % 84 (*)    Neutro Abs 11.1 (*)    Lymphocytes Relative 11 (*)    All other components within normal limits  COMPREHENSIVE METABOLIC PANEL - Abnormal; Notable for the following:    Glucose, Bld 118 (*)    All other components within normal limits  URINALYSIS, ROUTINE W REFLEX MICROSCOPIC -  Abnormal; Notable for the following:    Hgb urine dipstick LARGE (*)    Ketones, ur 15 (*)    All other components within normal limits  URINE MICROSCOPIC-ADD ON    Imaging Review Ct Abdomen Pelvis Wo Contrast  01/07/2015   CLINICAL DATA:  Right flank pain starting tonight. History of kidney stones  EXAM: CT ABDOMEN AND PELVIS WITHOUT CONTRAST  TECHNIQUE: Multidetector CT imaging of the abdomen and pelvis was performed following the standard protocol without IV contrast.  COMPARISON:  04/08/2009  FINDINGS: BODY WALL: Large left inguinal hernia, partly visualized, containing sigmoid colon. No associated bowel inflammation or obstruction.  LOWER CHEST: Unremarkable.  ABDOMEN/PELVIS:  Liver: No focal abnormality.  Biliary: No evidence of biliary obstruction or stone.  Pancreas: Unremarkable.  Spleen: Unremarkable.  Adrenals: Unremarkable.  Kidneys and ureters: 6 mm stone at the right UPJ with mild peripelvic fat inflammation. No hydronephrosis. There are punctate stones in the upper pole left kidney, nonobstructive. Presumed cyst in the interpolar left kidney with unchanged lobulation. The cyst is larger than prior, now up to 5 cm in diameter.  Bladder: Deviation of the apex toward the right inguinal ring. No inflammatory changes  Reproductive: Unremarkable.  Bowel: No obstruction. Negative appendix.  Retroperitoneum: No mass or adenopathy.  Peritoneum: No ascites or pneumoperitoneum.  Vascular: No acute abnormality.  OSSEOUS: No acute abnormalities. Advanced lower lumbar degenerative disc and facet disease.  IMPRESSION: 1. 6 mm stone at the right UPJ. There is minimal urothelial inflammation but no hydronephrosis. 2. Nonobstructive left nephrolithiasis. 3. Large left inguinal hernia containing sigmoid colon.   Electronically Signed   By: Marnee Spring M.D.   On: 01/07/2015 23:47     EKG Interpretation None      MDM   Final diagnoses:  History of kidney stones  Kidney stone    Medications   HYDROmorphone (DILAUDID) injection 1 mg (not administered)  ondansetron (ZOFRAN) injection 4 mg (not administered)  sodium chloride 0.9 % bolus 500 mL (0 mLs Intravenous Stopped 01/07/15 2249)  ondansetron (ZOFRAN) injection 4 mg (4 mg Intravenous Given 01/07/15 2142)  HYDROmorphone (DILAUDID) injection 1 mg (1 mg Intravenous Given 01/07/15 2142)   11:35 pm Patients pain is significantly relieved in the ER. He is waiting for urine and ct scan results. The CBC and BMP show no acute findings.  12:14pm CT scan shows 6 mm stone in the right UPJ, mild urothelial inflammation but no hydronephrosis. No obstruction, large non obstructive hernia that patient is already aware of in the left inguinal canal. No tenderness to this location.  Patient has been given the results. His pain was easily controlled here in the ED. Will give referral to Urology, pain medication, nausea medication and urine strainer.  59 y.o.Cole Coleman's evaluation in the Emergency Department is complete. It has been determined that no acute conditions requiring further emergency intervention are present at this time. The patient/guardian have  been advised of the diagnosis and plan. We have discussed signs and symptoms that warrant return to the ED, such as changes or worsening in symptoms.  Vital signs are stable at discharge. Filed Vitals:   01/07/15 2348  BP: 108/67  Pulse: 66  Temp:   Resp: 16    Patient/guardian has voiced understanding and agreed to follow-up with the PCP or specialist.    Dorthula Matas, PA-C 01/08/15 0015  Lyanne Co, MD 01/08/15 [phone]

## 2015-01-07 NOTE — ED Notes (Signed)
Patient transported to CT 

## 2015-01-07 NOTE — ED Notes (Signed)
Pt is c/o right flank pain that goes up to his rib in the back and down into his groin area  Pt has nausea and vomiting

## 2015-01-07 NOTE — ED Notes (Signed)
Pt states started having a sharp R flank pain around 1830 tonight, states has a hx of kidney stone in past.

## 2015-01-08 MED ORDER — ONDANSETRON HCL 4 MG/2ML IJ SOLN
4.0000 mg | Freq: Once | INTRAMUSCULAR | Status: AC
  Administered 2015-01-08: 4 mg via INTRAVENOUS
  Filled 2015-01-08: qty 2

## 2015-01-08 MED ORDER — HYDROMORPHONE HCL 1 MG/ML IJ SOLN
1.0000 mg | Freq: Once | INTRAMUSCULAR | Status: AC
  Administered 2015-01-08: 1 mg via INTRAVENOUS
  Filled 2015-01-08: qty 1

## 2015-01-08 MED ORDER — ONDANSETRON HCL 4 MG PO TABS: 4.0000 mg | ORAL_TABLET | Freq: Four times a day (QID) | ORAL | Status: AC

## 2015-01-08 MED ORDER — OXYCODONE-ACETAMINOPHEN 5-325 MG PO TABS: 1.0000 | ORAL_TABLET | Freq: Four times a day (QID) | ORAL | Status: AC | PRN

## 2015-01-08 NOTE — Discharge Instructions (Signed)
° °Kidney Stones °Kidney stones (urolithiasis) are deposits that form inside your kidneys. The intense pain is caused by the stone moving through the urinary tract. When the stone moves, the ureter goes into spasm around the stone. The stone is usually passed in the urine.  °CAUSES  °· A disorder that makes certain neck glands produce too much parathyroid hormone (primary hyperparathyroidism). °· A buildup of uric acid crystals, similar to gout in your joints. °· Narrowing (stricture) of the ureter. °· A kidney obstruction present at birth (congenital obstruction). °· Previous surgery on the kidney or ureters. °· Numerous kidney infections. °SYMPTOMS  °· Feeling sick to your stomach (nauseous). °· Throwing up (vomiting). °· Blood in the urine (hematuria). °· Pain that usually spreads (radiates) to the groin. °· Frequency or urgency of urination. °DIAGNOSIS  °· Taking a history and physical exam. °· Blood or urine tests. °· CT scan. °· Occasionally, an examination of the inside of the urinary bladder (cystoscopy) is performed. °TREATMENT  °· Observation. °· Increasing your fluid intake. °· Extracorporeal shock wave lithotripsy--This is a noninvasive procedure that uses shock waves to break up kidney stones. °· Surgery may be needed if you have severe pain or persistent obstruction. There are various surgical procedures. Most of the procedures are performed with the use of small instruments. Only small incisions are needed to accommodate these instruments, so recovery time is minimized. °The size, location, and chemical composition are all important variables that will determine the proper choice of action for you. Talk to your health care provider to better understand your situation so that you will minimize the risk of injury to yourself and your kidney.  °HOME CARE INSTRUCTIONS  °· Drink enough water and fluids to keep your urine clear or pale yellow. This will help you to pass the stone or stone  fragments. °· Strain all urine through the provided strainer. Keep all particulate matter and stones for your health care provider to see. The stone causing the pain may be as small as a grain of salt. It is very important to use the strainer each and every time you pass your urine. The collection of your stone will allow your health care provider to analyze it and verify that a stone has actually passed. The stone analysis will often identify what you can do to reduce the incidence of recurrences. °· Only take over-the-counter or prescription medicines for pain, discomfort, or fever as directed by your health care provider. °· Make a follow-up appointment with your health care provider as directed. °· Get follow-up X-rays if required. The absence of pain does not always mean that the stone has passed. It may have only stopped moving. If the urine remains completely obstructed, it can cause loss of kidney function or even complete destruction of the kidney. It is your responsibility to make sure X-rays and follow-ups are completed. Ultrasounds of the kidney can show blockages and the status of the kidney. Ultrasounds are not associated with any radiation and can be performed easily in a matter of minutes. °SEEK MEDICAL CARE IF: °· You experience pain that is progressive and unresponsive to any pain medicine you have been prescribed. °SEEK IMMEDIATE MEDICAL CARE IF:  °· Pain cannot be controlled with the prescribed medicine. °· You have a fever or shaking chills. °· The severity or intensity of pain increases over 18 hours and is not relieved by pain medicine. °· You develop a new onset of abdominal pain. °· You feel faint or pass   out. °· You are unable to urinate. °MAKE SURE YOU:  °· Understand these instructions. °· Will watch your condition. °· Will get help right away if you are not doing well or get worse. °Document Released: 10/26/2005 Document Revised: 06/28/2013 Document Reviewed: 03/29/2013 °[employer]®  Patient Information ©2015 [employer]. This information is not intended to replace advice given to you by your health care provider. Make sure you discuss any questions you have with your health care provider. ° °Urine Strainer °This strainer is used to catch or filter out any stones found in your urine. Place the strainer under your urine stream. Save any stones or objects that you find in your urine. Place them in a plastic or glass container to show your caregiver. The stones vary in size - some can be very small, so make sure you check the strainer carefully. Your caregiver may send the stone to the lab. When the results are back, your caregiver may recommend medicines or diet changes.  °Document Released: 07/31/2004 Document Revised: 01/18/2012 Document Reviewed: 09/07/2008 °Exit[employer]® Patient Information ©2015 [employer]. This information is not intended to replace advice given to you by your health care provider. Make sure you discuss any questions you have with your health care provider. ° °

## 2018-03-07 ENCOUNTER — Encounter
Admission: EM | Disposition: A | Discharge status: Home / Self Care | Payer: Self-pay | Source: Home / Self Care | Attending: Emergency Medicine

## 2018-03-07 ENCOUNTER — Emergency Department: Payer: Medicaid Other

## 2018-03-07 ENCOUNTER — Observation Stay
Admission: EM | Admit: 2018-03-07 | Discharge: 2018-03-08 | Disposition: A | Discharge status: Home / Self Care | Payer: Medicaid Other | Attending: Surgery | Admitting: Surgery

## 2018-03-07 ENCOUNTER — Emergency Department: Payer: Medicaid Other | Admitting: Anesthesiology

## 2018-03-07 DIAGNOSIS — F17210 Nicotine dependence, cigarettes, uncomplicated: Secondary | ICD-10-CM | POA: Insufficient documentation

## 2018-03-07 DIAGNOSIS — Z8249 Family history of ischemic heart disease and other diseases of the circulatory system: Secondary | ICD-10-CM | POA: Insufficient documentation

## 2018-03-07 DIAGNOSIS — R1032 Left lower quadrant pain: Secondary | ICD-10-CM

## 2018-03-07 DIAGNOSIS — K4090 Unilateral inguinal hernia, without obstruction or gangrene, not specified as recurrent: Secondary | ICD-10-CM

## 2018-03-07 DIAGNOSIS — Z833 Family history of diabetes mellitus: Secondary | ICD-10-CM | POA: Insufficient documentation

## 2018-03-07 DIAGNOSIS — K4030 Unilateral inguinal hernia, with obstruction, without gangrene, not specified as recurrent: Principal | ICD-10-CM | POA: Insufficient documentation

## 2018-03-07 DIAGNOSIS — Z882 Allergy status to sulfonamides status: Secondary | ICD-10-CM | POA: Insufficient documentation

## 2018-03-07 HISTORY — DX: Dorsalgia, unspecified: M54.9

## 2018-03-07 LAB — COMPREHENSIVE METABOLIC PANEL
ALT: 11 U/L (ref 0–55)
AST (SGOT): 20 U/L (ref 10–42)
Albumin/Globulin Ratio: 1.12 Ratio (ref 0.80–2.00)
Albumin: 3.8 gm/dL (ref 3.5–5.0)
Alkaline Phosphatase: 55 U/L (ref 40–145)
Anion Gap: 4.8 mMol/L — ABNORMAL LOW (ref 7.0–18.0)
BUN / Creatinine Ratio: 18.2 Ratio (ref 10.0–30.0)
BUN: 16 mg/dL (ref 7–22)
Bilirubin, Total: 0.4 mg/dL (ref 0.1–1.2)
CO2: 28 mMol/L (ref 20.0–30.0)
Calcium: 9.4 mg/dL (ref 8.5–10.5)
Chloride: 106 mMol/L (ref 98–110)
Creatinine: 0.88 mg/dL (ref 0.80–1.30)
EGFR: 93 mL/min/{1.73_m2} (ref 60–150)
Globulin: 3.4 gm/dL (ref 2.0–4.0)
Glucose: 75 mg/dL (ref 71–99)
Osmolality Calc: 270 mOsm/kg — ABNORMAL LOW (ref 275–300)
Potassium: 3.8 mMol/L (ref 3.5–5.3)
Protein, Total: 7.1 gm/dL (ref 6.0–8.3)
Sodium: 135 mMol/L — ABNORMAL LOW (ref 136–147)

## 2018-03-07 LAB — VH URINALYSIS WITH MICROSCOPIC IF INDICATED
Bilirubin, UA: NEGATIVE mg/dL
Blood, UA: NEGATIVE mg/dL
Glucose, UA: NEGATIVE mg/dL
Ketones UA: NEGATIVE
Leukocyte Esterase, UA: NEGATIVE Leu/uL
Nitrite, UA: NEGATIVE
Protein, UR: NEGATIVE mg/dL
Urine Specific Gravity: 1.019 (ref 1.001–1.040)
Urobilinogen, UA: 1 mg/dL — AB
pH, Urine: 6.5 pH (ref 5.0–8.0)

## 2018-03-07 LAB — CBC AND DIFFERENTIAL
Basophils %: 0.3 % (ref 0.0–3.0)
Basophils Absolute: 0 10*3/uL (ref 0.0–0.3)
Eosinophils %: 1.4 % (ref 0.0–7.0)
Eosinophils Absolute: 0.1 10*3/uL (ref 0.0–0.8)
Hematocrit: 46.7 % (ref 39.0–52.5)
Hemoglobin: 15.4 gm/dL (ref 13.0–17.5)
Lymphocytes Absolute: 1.5 10*3/uL (ref 0.6–5.1)
Lymphocytes: 16.1 % (ref 15.0–46.0)
MCH: 32 pg (ref 28–35)
MCHC: 33 gm/dL (ref 32–36)
MCV: 97 fL (ref 80–100)
MPV: 7.3 fL (ref 6.0–10.0)
Monocytes Absolute: 0.7 10*3/uL (ref 0.1–1.7)
Monocytes: 7.8 % (ref 3.0–15.0)
Neutrophils %: 74.3 % (ref 42.0–78.0)
Neutrophils Absolute: 6.7 10*3/uL (ref 1.7–8.6)
PLT CT: 240 10*3/uL (ref 130–440)
RBC: 4.8 10*6/uL (ref 4.00–5.70)
RDW: 11.9 % (ref 11.0–14.0)
WBC: 9 10*3/uL (ref 4.0–11.0)

## 2018-03-07 LAB — LIPASE: Lipase: 9 U/L (ref 8–78)

## 2018-03-07 SURGERY — REPAIR INGUINAL HERNIA W/ MESH UNILATERAL
Anesthesia: Anesthesia General | Site: Abdomen | Laterality: Left | Wound class: Clean

## 2018-03-07 MED ORDER — SODIUM CHLORIDE 0.9 % IV BOLUS
1000.00 mL | Freq: Once | INTRAVENOUS | Status: AC
Start: 2018-03-07 — End: 2018-03-07
  Administered 2018-03-07: 10:00:00 1000 mL via INTRAVENOUS

## 2018-03-07 MED ORDER — ONDANSETRON HCL 4 MG/2ML IJ SOLN
INTRAMUSCULAR | Status: DC | PRN
Start: 2018-03-07 — End: 2018-03-07
  Administered 2018-03-07: 4 mg via INTRAVENOUS

## 2018-03-07 MED ORDER — METOCLOPRAMIDE HCL 5 MG/ML IJ SOLN
10.0000 mg | Freq: Once | INTRAMUSCULAR | Status: DC | PRN
Start: 2018-03-07 — End: 2018-03-07

## 2018-03-07 MED ORDER — SUCCINYLCHOLINE CHLORIDE 20 MG/ML IJ SOLN
INTRAMUSCULAR | Status: AC
Start: 2018-03-07 — End: ?
  Filled 2018-03-07: qty 10

## 2018-03-07 MED ORDER — KETOROLAC TROMETHAMINE 30 MG/ML IJ SOLN
INTRAMUSCULAR | Status: AC
Start: 2018-03-07 — End: ?
  Filled 2018-03-07: qty 1

## 2018-03-07 MED ORDER — LIDOCAINE HCL (PF) 2 % IJ SOLN
INTRAMUSCULAR | Status: DC | PRN
Start: 2018-03-07 — End: 2018-03-07
  Administered 2018-03-07: 100 mg via INTRAVENOUS

## 2018-03-07 MED ORDER — EPHEDRINE SULFATE 50 MG/ML IJ/IV SOLN (WRAP)
Status: DC | PRN
Start: 2018-03-07 — End: 2018-03-07
  Administered 2018-03-07: 20 mg via INTRAVENOUS
  Administered 2018-03-07: 10 mg via INTRAVENOUS
  Administered 2018-03-07: 20 mg via INTRAVENOUS

## 2018-03-07 MED ORDER — ROCURONIUM BROMIDE 50 MG/5ML IV SOLN
INTRAVENOUS | Status: DC | PRN
Start: 2018-03-07 — End: 2018-03-07
  Administered 2018-03-07: 10 mg via INTRAVENOUS
  Administered 2018-03-07: 30 mg via INTRAVENOUS

## 2018-03-07 MED ORDER — FENTANYL CITRATE (PF) 50 MCG/ML IJ SOLN (WRAP)
25.0000 ug | INTRAMUSCULAR | Status: DC | PRN
Start: 2018-03-07 — End: 2018-03-07

## 2018-03-07 MED ORDER — ONDANSETRON HCL 4 MG/2ML IJ SOLN
4.0000 mg | Freq: Once | INTRAMUSCULAR | Status: DC | PRN
Start: 2018-03-07 — End: 2018-03-07

## 2018-03-07 MED ORDER — KETOROLAC TROMETHAMINE 30 MG/ML IJ SOLN
INTRAMUSCULAR | Status: DC | PRN
Start: 2018-03-07 — End: 2018-03-07
  Administered 2018-03-07: 30 mg via INTRAVENOUS

## 2018-03-07 MED ORDER — BUPIVACAINE-EPINEPHRINE (PF) 0.5% -1:200000 IJ SOLN
INTRAMUSCULAR | Status: DC | PRN
Start: 2018-03-07 — End: 2018-03-07
  Administered 2018-03-07: 10 mL via INTRAMUSCULAR

## 2018-03-07 MED ORDER — DEXAMETHASONE SODIUM PHOSPHATE 4 MG/ML IJ SOLN
INTRAMUSCULAR | Status: AC
Start: 2018-03-07 — End: ?
  Filled 2018-03-07: qty 2

## 2018-03-07 MED ORDER — FENTANYL CITRATE (PF) 50 MCG/ML IJ SOLN (WRAP)
INTRAMUSCULAR | Status: AC
Start: 2018-03-07 — End: ?
  Filled 2018-03-07: qty 5

## 2018-03-07 MED ORDER — OXYCODONE-ACETAMINOPHEN 5-325 MG PO TABS
1.0000 | ORAL_TABLET | ORAL | Status: DC | PRN
Start: 2018-03-07 — End: 2018-03-08
  Administered 2018-03-08: 2 via ORAL
  Filled 2018-03-07: qty 2

## 2018-03-07 MED ORDER — LACTATED RINGERS IV SOLN
INTRAVENOUS | Status: DC
Start: 2018-03-07 — End: 2018-03-08

## 2018-03-07 MED ORDER — FENTANYL CITRATE (PF) 50 MCG/ML IJ SOLN (WRAP)
INTRAMUSCULAR | Status: DC | PRN
Start: 2018-03-07 — End: 2018-03-07
  Administered 2018-03-07: 150 ug via INTRAVENOUS

## 2018-03-07 MED ORDER — METOPROLOL TARTRATE 5 MG/5ML IV SOLN
1.0000 mg | INTRAVENOUS | Status: DC | PRN
Start: 2018-03-07 — End: 2018-03-07

## 2018-03-07 MED ORDER — NALOXONE HCL 0.4 MG/ML IJ SOLN (WRAP)
0.4000 mg | INTRAMUSCULAR | Status: DC | PRN
Start: 2018-03-07 — End: 2018-03-08

## 2018-03-07 MED ORDER — PROPOFOL 200 MG/20ML IV EMUL
INTRAVENOUS | Status: DC | PRN
Start: 2018-03-07 — End: 2018-03-07
  Administered 2018-03-07: 200 mg via INTRAVENOUS

## 2018-03-07 MED ORDER — SODIUM CHLORIDE 0.9 % IV BOLUS
1000.00 mL | Freq: Once | INTRAVENOUS | Status: AC
Start: 2018-03-07 — End: 2018-03-07
  Administered 2018-03-07: 09:00:00 1000 mL via INTRAVENOUS

## 2018-03-07 MED ORDER — CEFAZOLIN SODIUM 1 G IJ SOLR
INTRAMUSCULAR | Status: AC
Start: 2018-03-07 — End: ?
  Filled 2018-03-07: qty 1000

## 2018-03-07 MED ORDER — ONDANSETRON HCL 4 MG/2ML IJ SOLN
4.00 mg | Freq: Once | INTRAMUSCULAR | Status: AC
Start: 2018-03-07 — End: 2018-03-07
  Administered 2018-03-07: 10:00:00 4 mg via INTRAVENOUS

## 2018-03-07 MED ORDER — IOHEXOL 300 MG/ML IJ SOLN
100.00 mL | Freq: Once | INTRAMUSCULAR | Status: AC | PRN
Start: 2018-03-07 — End: 2018-03-07
  Administered 2018-03-07: 09:00:00 100 mL via INTRAVENOUS
  Filled 2018-03-07: qty 100

## 2018-03-07 MED ORDER — HYDROMORPHONE HCL 0.5 MG/0.5 ML IJ SOLN
0.2000 mg | INTRAMUSCULAR | Status: DC | PRN
Start: 2018-03-07 — End: 2018-03-08

## 2018-03-07 MED ORDER — SODIUM CHLORIDE 0.9 % IJ SOLN
3.00 mL | Freq: Three times a day (TID) | INTRAMUSCULAR | Status: DC
Start: 2018-03-07 — End: 2018-03-08
  Administered 2018-03-07 – 2018-03-08 (×2): 3 mL via INTRAVENOUS

## 2018-03-07 MED ORDER — CEFAZOLIN SODIUM-DEXTROSE 1-4 GM-%(50ML) IV SOLR
1.0000 g | INTRAVENOUS | Status: AC
Start: 2018-03-07 — End: 2018-03-07
  Administered 2018-03-07: 14:00:00 1 g via INTRAVENOUS

## 2018-03-07 MED ORDER — ONDANSETRON HCL 4 MG/2ML IJ SOLN
INTRAMUSCULAR | Status: AC
Start: 2018-03-07 — End: ?
  Filled 2018-03-07: qty 2

## 2018-03-07 MED ORDER — FENTANYL CITRATE (PF) 50 MCG/ML IJ SOLN (WRAP)
INTRAMUSCULAR | Status: AC
Start: 2018-03-07 — End: ?
  Filled 2018-03-07: qty 2

## 2018-03-07 MED ORDER — ACETAMINOPHEN 10 MG/ML IV SOLN
INTRAVENOUS | Status: AC
Start: 2018-03-07 — End: ?
  Filled 2018-03-07: qty 100

## 2018-03-07 MED ORDER — LACTATED RINGERS IV SOLN
INTRAVENOUS | Status: DC | PRN
Start: 2018-03-07 — End: 2018-03-07

## 2018-03-07 MED ORDER — ENOXAPARIN SODIUM 40 MG/0.4ML SC SOLN
40.0000 mg | SUBCUTANEOUS | Status: DC
Start: 2018-03-07 — End: 2018-03-08
  Administered 2018-03-07: 18:00:00 40 mg via SUBCUTANEOUS
  Filled 2018-03-07: qty 0.4

## 2018-03-07 MED ORDER — DEXAMETHASONE SODIUM PHOSPHATE 4 MG/ML IJ SOLN (WRAP)
INTRAMUSCULAR | Status: DC | PRN
Start: 2018-03-07 — End: 2018-03-07
  Administered 2018-03-07: 8 mg via INTRAVENOUS

## 2018-03-07 MED ORDER — MIDAZOLAM HCL 2 MG/2ML IJ SOLN
INTRAMUSCULAR | Status: DC | PRN
Start: 2018-03-07 — End: 2018-03-07
  Administered 2018-03-07: 2 mg via INTRAVENOUS

## 2018-03-07 MED ORDER — EPHEDRINE SULFATE 50 MG/ML IJ/IV SOLN (WRAP)
Status: AC
Start: 2018-03-07 — End: ?
  Filled 2018-03-07: qty 1

## 2018-03-07 MED ORDER — MORPHINE SULFATE 2 MG/ML IJ/IV SOLN (WRAP)
2.0000 mg | Status: DC | PRN
Start: 2018-03-07 — End: 2018-03-07

## 2018-03-07 MED ORDER — SUCCINYLCHOLINE CHLORIDE 20 MG/ML IJ SOLN
INTRAMUSCULAR | Status: DC | PRN
Start: 2018-03-07 — End: 2018-03-07
  Administered 2018-03-07: 140 mg via INTRAVENOUS

## 2018-03-07 MED ORDER — ONDANSETRON HCL 4 MG/2ML IJ SOLN
4.0000 mg | Freq: Three times a day (TID) | INTRAMUSCULAR | Status: DC | PRN
Start: 2018-03-07 — End: 2018-03-08

## 2018-03-07 MED ORDER — FENTANYL CITRATE (PF) 50 MCG/ML IJ SOLN (WRAP)
50.00 ug | Freq: Once | INTRAMUSCULAR | Status: AC
Start: 2018-03-07 — End: 2018-03-07
  Administered 2018-03-07: 09:00:00 50 ug via INTRAVENOUS

## 2018-03-07 MED ORDER — ACETAMINOPHEN 10 MG/ML IV SOLN
INTRAVENOUS | Status: DC | PRN
Start: 2018-03-07 — End: 2018-03-07
  Administered 2018-03-07: 1000 mg via INTRAVENOUS

## 2018-03-07 MED ORDER — PROPOFOL 200 MG/20ML IV EMUL
INTRAVENOUS | Status: AC
Start: 2018-03-07 — End: ?
  Filled 2018-03-07: qty 20

## 2018-03-07 MED ORDER — SUGAMMADEX SODIUM 200 MG/2ML IV SOLN
INTRAVENOUS | Status: DC | PRN
Start: 2018-03-07 — End: 2018-03-07
  Administered 2018-03-07: 200 mg via INTRAVENOUS

## 2018-03-07 MED ORDER — ONDANSETRON 4 MG PO TBDP
4.0000 mg | ORAL_TABLET | Freq: Three times a day (TID) | ORAL | Status: DC | PRN
Start: 2018-03-07 — End: 2018-03-08

## 2018-03-07 MED ORDER — MIDAZOLAM HCL 2 MG/2ML IJ SOLN
INTRAMUSCULAR | Status: AC
Start: 2018-03-07 — End: ?
  Filled 2018-03-07: qty 2

## 2018-03-07 MED ORDER — SUGAMMADEX SODIUM 200 MG/2ML IV SOLN
INTRAVENOUS | Status: AC
Start: 2018-03-07 — End: ?
  Filled 2018-03-07: qty 2

## 2018-03-07 SURGICAL SUPPLY — 28 items
ADHESIVE DERMABOND MINI (Supply) ×2 IMPLANT
BLADE CLIPPER (Supply) ×2 IMPLANT
CHLORAPREP W/ORANGE TINT 26ML (Supply) ×2 IMPLANT
DETERGENT NEPTUNE DOCKING (Equipment) IMPLANT
DRAIN PENROSE .25X18 LATX FREE (TDC (Tubes, Draines, Catheters)) ×1
DRAIN PENROSE .25X18 LATX FREE (TDC (Tubes, Drains, Catheters)) ×1 IMPLANT
DRAPE UTILITY (Supply) ×2 IMPLANT
FILTER NEPTUNE 4 PORT (Supply) IMPLANT
GLOVE BIOGEL UND PI IND SZ 7.5 (Supply) ×6 IMPLANT
GLOVE BIOGEL UT PI SURG SZ 7.5 (Supply) ×6 IMPLANT
NDL HYPO 27GA X 1.5 (Supply) ×2 IMPLANT
PACK BASIC SINGLE BASIN (Supply) ×2 IMPLANT
PACK SMH GENERAL SURGERY (Supply) ×2 IMPLANT
PAD GROUNDING REM  (ELECTRODE) (Supply) ×2 IMPLANT
PLUG MESH MEDIUM ×2 IMPLANT
SLEEVE SCD KNEE MED EXPRESS (Supply) ×2 IMPLANT
SOL SALINE IRRIG 1000ML (Solutions) ×2 IMPLANT
SOL WATER STERILE IRRG 1000ML (Solutions) ×2 IMPLANT
SUT PROLENE 2-0 8411H (Supply) ×2 IMPLANT
SUT PROLENE 2-0 C826G (Supply) ×2 IMPLANT
SUT VICRYL 0 CT-2 (Supply) ×2 IMPLANT
SUT VICRYL 2-0 J206G (Supply) ×2 IMPLANT
SUT VICRYL 2-0 J317H (Supply) ×2 IMPLANT
SUT VICRYL 3-0 UNDYED CT-1 (Supply) ×4 IMPLANT
SUT VICRYL 4-0 J496H (Supply) ×2 IMPLANT
SYRINGE 12CC CONTROL LL (Supply) ×2 IMPLANT
TUBE CLIPVAC DISPOSABLE (Supply) ×2 IMPLANT
UNDERPAD BLUE 23X36  LF (Supply) ×2 IMPLANT

## 2018-03-07 NOTE — Transfer of Care (Signed)
Anesthesia Transfer of Care Note    Patient: Alexander Merritt    Last vitals:   Vitals:    03/07/18 1504   BP: 121/78   Pulse: 86   Resp: 14   Temp: 36.4 C (97.5 F)   SpO2: 99%       Oxygen: Nasal Cannula     Mental Status:awake    Airway: Natural    Cardiovascular Status:  stable

## 2018-03-07 NOTE — Op Note (Signed)
JABORI, HENEGAR  04/07/1956  03/07/2018  [id number]      Inguinal Hernia Operative Note (Full Operative Note)      Procedure:Repair left Inguinal Hernia Incarcerated  [id number] with polypropylene mesh, open approach.    Pre-operative Diagnosis: left Inguinal hernia, Obstructed, non-recurrent, No gangreen    Post-operative Diagnosis: left Inguinal hernia, Obstructed, non-recurrent, No gangreen    Surgeon: Enzo Montgomery. Ayvah Caroll Jr. MD    Assistant:    Circulator: Laure Kidney, RN  Scrub Person: Roswell Miners D  Dr. Shanon Ace R1    Indications:    62 yo male with a slowly enlarging hernia that has become larger and mostly "stuck" recently.  Today he had abdominal pain and left groin pain and it would not reduce at all.  Some nausea.  He had some mild abdominal tenderness and a very tender left incarcerated inguinal hernia.    Referred by: ER     Anesthesia:General with local     EBL: 10 cc    Findings:   Large hernia sac, indirect.  Bowel in the LLQ appeared normal.  Minimal peritoneal fluid (not bloody).    Specimens: None     Other, Additional(antibiotics, Betablocker, SCD): SCD.  Ancef 1 gm IV.    Procedure Details     The patient was placed in a supine position. Next,after induction of general anesthesia (and using marcaine for local), he was prepped and draped in usual sterile fashion.     A standard left  groin incision was made within relaxed skin tension lines and carried down to the external oblique, which was opened down the external ring. The spermatic cord was mobilized.  A indirect hernia was found.    The hernia sac was dissected just distal to the internal ring and was encircled.  It was transected between hemostats.  The distal sac which extended to the distal scrotum was left in situ and the edges cauterized and hemostasis assured.  The proximal sac was dissected and debrided to the internal ring and then closed with a running 3-0 vicryl and reduced.A medium Bard plug was placed and the leaves secured  with interrupted sutures of 0-Vicryl.  The Bard patch was secured with a few interrupted 2-O prolene sutures to the inguinal floor. The spermatic cord was placed in its anatomic position and then the external oblique closed with running 2-0 Vicryl. Scarpa's fascia was closed with interrupted 3-0 Vicryl and skin was closed with running subcuticular 4-0 Vicryl.    Note that hemostasis was assured and sponge and needle counts were correct.    He tolerated the procedure well.     Doylene Canard, MD

## 2018-03-07 NOTE — ED Provider Notes (Signed)
Physician/Midlevel provider first contact with patient: 03/07/18 0819          [facility]  Emergency Department History and Physical       Clinical Impression  1. Left inguinal hernia        Disposition  ED Disposition     ED Disposition Condition Date/Time Comment    [facility] Admit to Medical/Surgical  Mon Mar 07, 2018 11:56 AM             Date: 03/07/2018  Patient Name: Alexander Merritt  Attending Physician: Nicolasa Ducking, MD  Patient DOB:  Jun 16, 1956  MRN:  [id number]  Room:  207/207-A    Patient was evaluated by ED physician, Nicolasa Ducking, MD     at 8:28 AM.    History of Present Illness     Chief Complaint   Patient presents with   . Groin Swelling   . Groin Pain     History provided by: Patient     Baruch Lewers is a 62 y.o. male with four days of left groin swelling and pain that radiates to the abdomen that progressively got worse two days ago. He lifted a heavy gas heater before pain started this time. He has chills and nausea. He reports this has been going on for some time but usual is able to reduce the mass. Last defecated yesterday. No flatus. No vomiting, diarrhea ro constipation. No dysuria.     PMD: Pcp, Noneorunknown, MD  Review of Systems     Constitutional:  Denies fever, weakness  Eyes: No change in vision.  ENT: No sore throat, dysphagia, ear pain or difficulty hearing.  CV:  No chest pain or palpitations.  Resp:  No dyspnea, cough or hemoptysis.  GI: No flank pain, hematemesis, melena or hematochezia.  GU: No hematuria  MS: No neck pain, back pain, myalgias, or arthralgias.  Skin: No rash.  Neuro:  No headache, arm or leg weakness or numbness.  Psych:  No behavior changes    Past Medical History     Past Medical History:   Diagnosis Date   . Back pain    . Collapsed lung 1990    "from stress" on right side; had temporary chest tube, no surgery   . Inguinal hernia, left 2015     Past Surgical History:   Procedure Laterality Date   . BACK SURGERY       History reviewed. No pertinent  family history.  Social History     Social History   . Marital status: Single     Spouse name: N/A   . Number of children: N/A   . Years of education: N/A     Social History Main Topics   . Smoking status: Current Every Day Smoker     Packs/day: 0.75     Types: Cigarettes   . Smokeless tobacco: Never Used   . Alcohol use No   . Drug use: No   . Sexual activity: Not on file     Other Topics Concern   . Not on file     Social History Narrative   . No narrative on file     Allergies   Allergen Reactions   . Sulfa Antibiotics Nausea And Vomiting       Current Medications       Current Facility-Administered Medications:   .  enoxaparin (LOVENOX) syringe 40 mg, 40 mg, Subcutaneous, Q24H, Meltvedt, Enzo Montgomery, MD  .  HYDROmorphone (DILAUDID)  injection 0.2 mg, 0.2 mg, Intravenous, Q3H PRN, Meltvedt, Enzo Montgomery, MD  .  lactated ringers infusion, , Intravenous, Continuous, Meltvedt, Enzo Montgomery, MD  .  naloxone [facility]) injection 0.4 mg, 0.4 mg, Intravenous, PRN, Meltvedt, Enzo Montgomery, MD  .  ondansetron (ZOFRAN-ODT) disintegrating tablet 4 mg, 4 mg, Oral, Q8H PRN **OR** ondansetron (ZOFRAN) injection 4 mg, 4 mg, Intravenous, Q8H PRN, Meltvedt, Enzo Montgomery, MD  .  oxyCODONE-acetaminophen (PERCOCET) 5-325 MG per tablet 1-2 tablet, 1-2 tablet, Oral, Q4H PRN, Meltvedt, Enzo Montgomery, MD  .  sodium chloride (PF) 0.9 % injection 3 mL, 3 mL, Intravenous, Q8H, Meltvedt, Enzo Montgomery, MD   Physical Exam     ED Triage Vitals [03/07/18 0815]   Enc Vitals Group      BP 106/70      Heart Rate 83      Resp Rate 18      Temp 97.2 F (36.2 C)      Temp Source Oral      SpO2 100 %      Weight 81.6 kg      Height 1.753 m      Head Circumference       Peak Flow       Pain Score 8      Pain Loc       Pain Edu?       Excl. in GC?      Constitutional: Alert, severe acute distress.   Head: Normocephalic, atraumatic  Eyes: No conjunctival injection. Sclera is normal.  No discharge.  Ears, Nose, Throat:  Normal external examination of the nose and ears. Moist membranes  without lesion or inflammation.  Neck: Normal range of motion. Trachea midline.  No lymphadenopathy.  Respiratory/Chest: Clear to auscultation. No respiratory distress.   Cardiovascular: Regular rate and rhythm. No murmurs.  Abdomen:   Soft. LLQ tenderness.  Bowel sounds active and normal. No rebound or guarding.  GU:  Normal phallus without discharge or tenderness.Testicles are descended bilaterally with posterior epididymal location.  Left scrotum is large and tender that extends to the left inguinal and LLQ area  no erythema. Normal cremasteric reflex is present bilaterally. No lymphadenopathy or perineal tenderness.  Back: Left CVA tenderness    Upper Extremity:  No edema. No cyanosis.  Lower Extremity:  No edema. No cyanosis.  Skin: Warm and dry. No rash.  Psychiatric:  Normal affect.  Normal insight  Neuro: CN's grossly intact, conversation is on topic with clear speech, moves all four extremities.    ED Medications Administered     Medications   sodium chloride (PF) 0.9 % injection 3 mL (not administered)   ondansetron (ZOFRAN-ODT) disintegrating tablet 4 mg (not administered)     Or   ondansetron (ZOFRAN) injection 4 mg (not administered)   naloxone (NARCAN) injection 0.4 mg (not administered)   lactated ringers infusion (not administered)   enoxaparin (LOVENOX) syringe 40 mg (not administered)   oxyCODONE-acetaminophen (PERCOCET) 5-325 MG per tablet 1-2 tablet (not administered)   HYDROmorphone (DILAUDID) injection 0.2 mg (not administered)   sodium chloride 0.9 % bolus 1,000 mL (0 mLs Intravenous Stopped 03/07/18 0930)   fentaNYL (PF) (SUBLIMAZE) injection 50 mcg (50 mcg Intravenous Given 03/07/18 0848)   sodium chloride 0.9 % bolus 1,000 mL (0 mLs Intravenous Stopped 03/07/18 1246)   iohexol (OMNIPAQUE) 300 MG/ML injection 100 mL (100 mLs Intravenous Imaging Agent Given 03/07/18 0920)   ondansetron (ZOFRAN) injection 4 mg (4 mg Intravenous Given 03/07/18 0949)  ceFAZolin (ANCEF) 1 g IVPB 50 mL (duplex) (1 g  Intravenous Given 03/07/18 1339)     Orders Placed During this Encounter     Orders Placed This Encounter   Procedures   . CT Abdomen Pelvis W IV/ WO PO Cont   . CBC   . CMP   . Lipase   . Urinalysis with Microscopic if Indicated   . Cardiac Monitoring (Hard Wire)   . Pulse Ox   . Vital signs   . Intake and Output   . Mobility Protocol   . Notify physician   . Incentive spirometry nursing   . Place sequential compression device   . Maintain sequential compression device   . Advance diet as tolerated (to Regular Diet)   . Full Code   . Consult for SBIRT   . Saline lock IV   . saline lock IV   . Place  for Observation Services   . Transfer patient     Data Review     Nursing records reviewed and agree: Yes    Diagnostic Study Results   Labs     Results     Procedure Component Value Units Date/Time    Urinalysis with Microscopic if Indicated [[id number]]  (Abnormal) Collected:  03/07/18 0927    Specimen:  Urine, Random Updated:  03/07/18 0940     Color, UA Yellow     Clarity, UA Clear     Specific Gravity, UR 1.019     pH, Urine 6.5 pH      Protein, UR Negative mg/dL      Glucose, UA Negative mg/dL      Ketones UA Negative     Bilirubin, UA Negative mg/dL      Blood, UA Negative mg/dL      Nitrite, UA Negative     Urobilinogen, UA 1.0 (A) mg/dL      Leukocyte Esterase, UA Negative Leu/uL     CMP [[id number]]  (Abnormal) Collected:  03/07/18 0834    Specimen:  Plasma Updated:  03/07/18 0906     Sodium 135 (L) mMol/L      Potassium 3.8 mMol/L      Chloride 106 mMol/L      CO2 28.0 mMol/L      Calcium 9.4 mg/dL      Glucose 75 mg/dL      Creatinine 6.21 mg/dL      BUN 16 mg/dL      Protein, Total 7.1 gm/dL      Albumin 3.8 gm/dL      Alkaline Phosphatase 55 U/L      ALT 11 U/L      AST (SGOT) 20 U/L      Bilirubin, Total 0.4 mg/dL      Albumin/Globulin Ratio 1.12 Ratio      Anion Gap 4.8 (L) mMol/L      BUN/Creatinine Ratio 18.2 Ratio      EGFR 93 mL/min/1.58m2      Osmolality Calc 270 (L) mOsm/kg      Globulin 3.4 gm/dL      Lipase [[id number]] Collected:  03/07/18 0834    Specimen:  Plasma Updated:  03/07/18 0906     Lipase 9 U/L     CBC [[id number]] Collected:  03/07/18 0834    Specimen:  Blood from Blood Updated:  03/07/18 0850     WBC 9.0 K/cmm      RBC 4.80 M/cmm      Hemoglobin 15.4 gm/dL  Hematocrit 46.7 %      MCV 97 fL      MCH 32 pg      MCHC 33 gm/dL      RDW 40.9 %      PLT CT 240 K/cmm      MPV 7.3 fL      NEUTROPHIL % 74.3 %      Lymphocytes 16.1 %      Monocytes 7.8 %      Eosinophils % 1.4 %      Basophils % 0.3 %      Neutrophils Absolute 6.7 K/cmm      Lymphocytes Absolute 1.5 K/cmm      Monocytes Absolute 0.7 K/cmm      Eosinophils Absolute 0.1 K/cmm      BASO Absolute 0.0 K/cmm           Radiologic Studies  Radiology Results (24 Hour)     Procedure Component Value Units Date/Time    CT Abdomen Pelvis W IV/ WO PO Cont [[id number]] Collected:  03/07/18 0928    Order Status:  Completed Updated:  03/07/18 0939    Narrative:       INDICATION:  Abdominal pain, nausea and low grade fever. Known hernia.    EXAMINATION: CT Abdomen and Pelvis with contrast    COMPARISON: None available.    PROTOCOL:  Contiguous axial CT images were obtained from the chest base through the ischial tuberosities following uneventful intravenous administration of 100 cc of Omnipaque 300. Sagittal and coronal reformatted images were also provided. Automatic   exposure control was utilized.    EXAM QUALITY:  Diagnostic quality: Satisfactory.  Comments: None.    FINDINGS:    Chest Base: Mild atelectasis in the bases but no pneumonia or pleural effusion.    Liver: Unremarkable    Gallbladder: Unremarkable, although ultrasound is more sensitive for gallbladder pathology.    Biliary Tract:  No ductal dilatation.    Pancreas: Unremarkable    Spleen: Unremarkable    Adrenals: Unremarkable    Kidneys and Collecting Systems: There is 5.6 cm cyst in the left interpolar area. There is a 6.6 mm nonobstructing stone in the right lower pole infundibulum. 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 The left side is   conventional in morphology. There is a 3 mm nonobstructing stone in the left interpolar area. There is fullness in the right upper tract and renal pelvis. Right ureter is slightly full but does not appear obstructed.    Lymph  Nodes: There is no enlarged adenopathy.    Vessels: Mild to moderate atherosclerotic calcifications. No aneurysm. No venous filling defects.    GI Tract/Mesentery:  There is no small bowel obstruction. There are diverticula the in the lower descending and sigmoid colon but no acute inflammation.  Appendix: Identified and unremarkable.    Peritoneal Cavity:  No free intraperitoneal free air or fluid.   Abdominal Wall/Inguinal Canals: Moderate to large left inguinal hernia containing unobstructed small bowel.    Reproductive Organs:  Seminal vesicles are both slightly bulbous but with no associated fat edema. Prostate is normal in size.    Bladder: Bladder is incompletely filled although it does show some mild generalized wall thickening. There is no calcified stone in the bladder or air within the bladder or bladder wall.    Soft Tissues/Musculoskeletal: No acute osseous abnormality.  Soft tissues are unremarkable.      Impression:  IMPRESSION:    1. Bilateral nonobstructing renal stones, larger on the right.  2. There is fullness in the right collecting system and ureter. There is no focal obstruction although this could represent reflux. Inflammation could also produce this finding.  3. There is bladder wall thickening which could be relating to outlet obstruction or inflammation.  4. There is no appendicitis or acute inflammation in the abdomen or pelvis otherwise.  5. Nonobstructing left inguinal hernia.    ReadingStation:[id number]          Course Notes, Procedures and MDM   0941: I tried to reduce the left inguinal hernia with steady pressure for  Three minutes. It was too painful. Unsuccessful. He is  nauseated. Will try Zofran. He is laying in the trendelenburg position.   1006: Call out to General surgery.   1033: Dr. Faylene Million is currently in the OR.   1140: Was seen by surgery and taken to the OR.  Diagnosis and Disposition     Clinical Impression  1. Left inguinal hernia        Disposition  ED Disposition     ED Disposition Condition Date/Time Comment    [facility] Admit to Medical/Surgical  Mon Mar 07, 2018 11:56 AM           Follow Up  No follow-up provider specified.    Prescriptions    Current Discharge Medication List                     THIS CHART WAS DOCUMENTED BY ELIZABETH DAWSON, SCRIBE.            Nicolasa Ducking, MD  03/07/18 [phone]

## 2018-03-07 NOTE — Consults (Signed)
Alexander Merritt was given a universal screen and was Positive for tobacco.       Patient reported starting smoking young and then quitting for 17 years.  He quit with the power of his faith and praying to God. Patient resumed smoking after spending time with a relative in a house where there was smoking. Clinician and patient discussed triggers to smoking.  Also, discussed having something in your hand like a straw or toothpick to use instead of tobacco product.   Patient would like to quit smoking again and hopes that this ED admission will be the catalyst to move forward with his quitting.  Patient accepted the handout on smoking cessation and Quit Now.      In total, 5 minutes of personal time was spent with this patient.

## 2018-03-07 NOTE — ED Notes (Signed)
Pt is alert and oriented x 4, pink/warm/dry, respirations regular and non-labored. Pt aware of plan of care. Call bell in reach, will continue to monitor.

## 2018-03-07 NOTE — ED Notes (Signed)
Dr Jovita Gamma seeing pt now.

## 2018-03-07 NOTE — Plan of Care (Signed)
Problem: Moderate/High Fall Risk Score >5  Goal: Patient will remain free of falls  Outcome: Progressing

## 2018-03-07 NOTE — H&P (Signed)
[facility]  ADMISSION HISTORY AND PHYSICAL     Date and Time: 03/07/18 12:32 PM  Patient Name: Alexander Merritt, Alexander Merritt  DOB: August 07, 1956, 62 y.o.  MRN: [id number]    Attending Physician: Nicolasa Ducking, MD  Primary Care Physician: Christa See, MD    Code Status: full    CC:   Chief Complaint   Patient presents with   . Groin Swelling   . Groin Pain     Assessment and plan:                                                                                        Principal Problem:    Non-recurrent unilateral inguinal hernia with obstruction without gangrene    Left inguinal hernia, large not fully reducible.  Chronic for 4 years with acute inability to self-reduce.  Now incarcerated with some abdominal discomfort and nausea.    Discussion held with patient.  Recommendation made to go ahead with repair now given the scrotal and abdominal discomfort and inability to fully reduce the hernia.  Risks and potential complications discussed.  He wishes to proceed.          History of Presenting Illness:                                                              Alexander Merritt is a 62 y.o. male who presents to the hospital with abdominal pain and inability to reduce hernia. Patient claims he has had a left inguinal hernia for 4 years. He is normally able to reduce it on his own. On Friday, after heavy lifting at work, he has been unable to reduce his hernia. He also has worsening abdominal pain/testicular pain. Pan is generalized in abdomen and also left groin and testicle. Pain is achy, 8/10, constant, with no radiation.     Patient has had nausea but no vomiting. He denies fever. Last BM last night. He is still passing gas today.    In ER, patient claim that ER doctor was able to reduce his hernia, but the pain did not improve and hernia came back down 1 hour later. CT showed non obstructed hernia. Labs unremarkable. Surgery called to evaluate patient.     Review Of Systems:                                                                                Review of Systems   Constitutional: Negative for chills and fever.   HENT: Negative for congestion and sore throat.    Respiratory: Negative for cough, shortness of breath and wheezing.    Cardiovascular: Negative for  chest pain and leg swelling.   Gastrointestinal: Positive for abdominal pain and nausea. Negative for blood in stool, constipation, diarrhea, melena and vomiting.   Genitourinary: Negative for dysuria.   Skin: Negative for rash.       Past Medical History:                                                                         Past Medical History:   Diagnosis Date   . Back pain    . Collapsed lung 1990    "from stress" on right side; had temporary chest tube, no surgery   . Inguinal hernia, left 2015       Past Surgical History:                                                                      Past Surgical History:   Procedure Laterality Date   . BACK SURGERY         Family History:                                                                                       History reviewed. No pertinent family history.   DM and unknown cardiac disease in parents    Social History:                                                                                        Social History     Social History   . Marital status: Single     Spouse name: N/A   . Number of children: N/A   . Years of education: N/A     Occupational History   . Not on file.     Social History Main Topics   . Smoking status: Current Every Day Smoker     Packs/day: 0.75     Types: Cigarettes   . Smokeless tobacco: Never Used   . Alcohol use No   . Drug use: No   . Sexual activity: Not on file     Other Topics Concern   . Not on file     Social History Narrative   . No narrative on file       Allergies:  Allergies   Allergen Reactions   . Sulfa Antibiotics Nausea And Vomiting       Medications:                                                                                             No current facility-administered medications on file prior to encounter.      No current outpatient prescriptions on file prior to encounter.       Physical Exam:                                                                                        Patient Vitals for the past 24 hrs:   BP Temp Temp src Pulse Resp SpO2 Height Weight   03/07/18 1032 124/75 - - 82 15 97 % - -   03/07/18 1002 133/86 - - 72 13 100 % - -   03/07/18 0941 153/81 - - 78 13 100 % - -   03/07/18 0902 111/66 - - 75 16 100 % - -   03/07/18 0848 - - - 81 19 100 % - -   03/07/18 0844 141/73 - - 77 15 100 % - -   03/07/18 0815 106/70 97.2 F (36.2 C) Oral 83 18 100 % 1.753 m (5\' 9" ) 81.6 kg (180 lb)     Body mass index is 26.58 kg/m.    Intake/Output Summary (Last 24 hours) at 03/07/18 1232  Last data filed at 03/07/18 0930   Gross per 24 hour   Intake             1000 ml   Output                0 ml   Net             1000 ml       Physical Exam   Constitutional: He is oriented to person, place, and time. He appears well-developed and well-nourished. No distress.   HENT:   Head: Normocephalic and atraumatic.   Mouth/Throat: No oropharyngeal exudate.   Eyes: Right eye exhibits no discharge. Left eye exhibits no discharge. No scleral icterus.   Cardiovascular: Normal rate, regular rhythm, normal heart sounds and intact distal pulses.  Exam reveals no friction rub.    No murmur heard.  Pulmonary/Chest: Effort normal and breath sounds normal. No respiratory distress. He has no wheezes. He has no rales.   Abdominal: Soft. Bowel sounds are normal. He exhibits no distension and no mass. There is tenderness (mild general tenderness, more on left). There is no rebound and no guarding. A hernia is present. Hernia confirmed positive in the left inguinal area (Large, tender left inguinal hernia extending into and enlarging the scrotum.  It cannot be fully reduced.   ).  Hernia confirmed negative in the right inguinal area.   Neurological: He is alert and oriented to person, place, and time.   Skin: Skin is warm and dry. No rash noted. He is not diaphoretic. No erythema. No pallor.       Labs and Imaging:                                                                                   Results     Procedure Component Value Units Date/Time    Urinalysis with Microscopic if Indicated [[id number]]  (Abnormal) Collected:  03/07/18 0927    Specimen:  Urine, Random Updated:  03/07/18 0940     Color, UA Yellow     Clarity, UA Clear     Specific Gravity, UR 1.019     pH, Urine 6.5 pH      Protein, UR Negative mg/dL      Glucose, UA Negative mg/dL      Ketones UA Negative     Bilirubin, UA Negative mg/dL      Blood, UA Negative mg/dL      Nitrite, UA Negative     Urobilinogen, UA 1.0 (A) mg/dL      Leukocyte Esterase, UA Negative Leu/uL     CMP [[id number]]  (Abnormal) Collected:  03/07/18 0834    Specimen:  Plasma Updated:  03/07/18 0906     Sodium 135 (L) mMol/L      Potassium 3.8 mMol/L      Chloride 106 mMol/L      CO2 28.0 mMol/L      Calcium 9.4 mg/dL      Glucose 75 mg/dL      Creatinine 7.82 mg/dL      BUN 16 mg/dL      Protein, Total 7.1 gm/dL      Albumin 3.8 gm/dL      Alkaline Phosphatase 55 U/L      ALT 11 U/L      AST (SGOT) 20 U/L      Bilirubin, Total 0.4 mg/dL      Albumin/Globulin Ratio 1.12 Ratio      Anion Gap 4.8 (L) mMol/L      BUN/Creatinine Ratio 18.2 Ratio      EGFR 93 mL/min/1.74m2      Osmolality Calc 270 (L) mOsm/kg      Globulin 3.4 gm/dL     Lipase [[id number]] Collected:  03/07/18 0834    Specimen:  Plasma Updated:  03/07/18 0906     Lipase 9 U/L     CBC [[id number]] Collected:  03/07/18 0834    Specimen:  Blood from Blood Updated:  03/07/18 0850     WBC 9.0 K/cmm      RBC 4.80 M/cmm      Hemoglobin 15.4 gm/dL      Hematocrit 52.8 %      MCV 97 fL      MCH 32 pg      MCHC 33 gm/dL      RDW 41.3 %      PLT CT 240 K/cmm      MPV 7.3 fL      NEUTROPHIL % 74.3 %  Lymphocytes 16.1 %      Monocytes 7.8 %      Eosinophils % 1.4 %      Basophils % 0.3 %      Neutrophils Absolute 6.7 K/cmm      Lymphocytes Absolute 1.5 K/cmm      Monocytes Absolute 0.7 K/cmm      Eosinophils Absolute 0.1 K/cmm      BASO Absolute 0.0 K/cmm           CT Abdomen Pelvis W IV/ WO PO Cont   Final Result   IMPRESSION:     1. Bilateral nonobstructing renal stones, larger on the right.   2. There is fullness in the right collecting system and ureter. There is no focal obstruction although this could represent reflux. Inflammation could also produce this finding.   3. There is bladder wall thickening which could be relating to outlet obstruction or inflammation.   4. There is no appendicitis or acute inflammation in the abdomen or pelvis otherwise.   5. Nonobstructing left inguinal hernia.      ReadingStation:[id number]            Signed by:    Doylene Canard, MD             cc: Christa See, MD

## 2018-03-07 NOTE — Anesthesia Postprocedure Evaluation (Signed)
Anesthesia Post Evaluation    Patient: Alexander Merritt    Procedures performed: Procedure(s):  Repair Inguinal Hernia W/ Mesh LEFT INCARCERATED    Anesthesia type: General ETT    Patient location:PACU    Last vitals:   Vitals:    03/07/18 1504   BP: 121/78   Pulse: 86   Resp: 14   Temp: 36.4 C (97.5 F)   SpO2: 99%       Post pain: Patient not complaining of pain, continue current therapy      Mental Status:awake    Respiratory Function: tolerating room air    Cardiovascular: stable    Nausea/Vomiting: patient not complaining of nausea or vomiting    Hydration Status: adequate    Post assessment: no apparent anesthetic complications

## 2018-03-07 NOTE — Plan of Care (Signed)
Problem: Moderate/High Fall Risk Score >5  Goal: Patient will remain free of falls  Patient still sedated, but fully understands fall precautions and doesn't attempt to get up on his own

## 2018-03-07 NOTE — Anesthesia Preprocedure Evaluation (Addendum)
Anesthesia Evaluation    AIRWAY    Mallampati: II    TM distance: >3 FB  Neck ROM: full  Mouth Opening:full  Planned to use difficult airway equipment: No CARDIOVASCULAR    cardiovascular exam normal       DENTAL           PULMONARY    pulmonary exam normal     OTHER FINDINGS                  Relevant Problems   No relevant active problems       PSS Anesthesia Comments: Note loose teeth. Discussed risk of damage of teeth.         Anesthesia Plan    ASA 2     general                     Detailed anesthesia plan: general endotracheal            informed consent obtained      pertinent labs reviewed             Signed by: Suszanne Finch 03/07/18 1:15 PM

## 2018-03-07 NOTE — ED Notes (Addendum)
Placed BP cuff, monitor and pulse ox on patient

## 2018-03-08 ENCOUNTER — Encounter: Payer: Self-pay | Admitting: Surgery

## 2018-03-08 DIAGNOSIS — Z48815 Encounter for surgical aftercare following surgery on the digestive system: Secondary | ICD-10-CM

## 2018-03-08 MED ORDER — OXYCODONE-ACETAMINOPHEN 5-325 MG PO TABS
1.00 | ORAL_TABLET | Freq: Four times a day (QID) | ORAL | 0 refills | Status: AC | PRN
Start: 2018-03-08 — End: ?

## 2018-03-08 NOTE — UM Notes (Signed)
[facility] Utilization Management Review Sheet    Facility :  [facility]    NAME: Alexander Merritt  MR#: [id number]    CSN#: [id number]    ROOM: 207/207-A AGE: 62 y.o.    ADMIT DATE AND TIME: 03/07/2018  8:19 AM      PATIENT CLASS: Observation    ATTENDING PHYSICIAN: Merritt, Alexander Montgomery, MD    PAYOR:Payor: /       AUTH #: NAR    DIAGNOSIS:     ICD-10-CM    1. Left inguinal hernia K40.90        HISTORY:   Past Medical History:   Diagnosis Date   . Back pain    . Collapsed lung 1990    "from stress" on right side; had temporary chest tube, no surgery   . Inguinal hernia, left 2015       DATE OF REVIEW: 03/08/2018     History of present illness:  "Patient admitted through emergency department who presented with abdominal pain and inability to reduce hernia. Patient claims he has had a left inguinal hernia for 4 years. He is normally able to reduce it on his own. On Friday, after heavy lifting at work, he has been unable to reduce his hernia. He also has worsening abdominal pain/testicular pain. Pan is generalized in abdomen and also left groin and testicle. Pain is achy, 8/10, constant, with no radiation. In ER, patient claim that ER doctor was able to reduce his hernia, but the pain did not improve and hernia came back down 1 hour later. CT showed non obstructed hernia. Labs unremarkable. Surgery called to evaluate patient". Per MD H&P    Vitals upon admission:  97.2-106/70-83-18-100%    Treatment in emergency department:  Patient received Fentanyl IV x 1, Zofran IV x 1, and NS bolus x 2 in emergency department.    Physical exam:  Notable for mild general abdominal tenderness, more on the left. Confirmed positive hernia in the left inguinal area, large tender left inguinal hernia extending into and enlarging the scrotum. It cannot be fully reduced per MD documentation.    Abnormal labs:  Na 135    Imaging:  CT abdomen/pelvis:  1. Bilateral nonobstructing renal stones, larger on the right.   2. There is fullness in  the right collecting system and ureter. There is no focal obstruction although this could represent reflux. Inflammation could also produce this finding.   3. There is bladder wall thickening which could be relating to outlet obstruction or inflammation.   4. There is no appendicitis or acute inflammation in the abdomen or pelvis otherwise.   5. Nonobstructing left inguinal hernia.     Assessment/Plan:  Left inguinal hernia, large not fully reducible.  Chronic for 4 years with acute inability to self-reduce.  Now incarcerated with some abdominal discomfort and nausea.    Discussion held with patient.  Recommendation made to go ahead with repair now given the scrotal and abdominal discomfort and inability to fully reduce the hernia.  Risks and potential complications discussed.  He wishes to proceed.    Merritt, Alexander Montgomery, MD Physician Signed Surgery  Op Note   Date of Service: 03/07/2018 2:44 PM Creation Time: 03/07/2018 2:44 PM         [] Hide copied text  [] Hover for attribution information    Alexander Merritt  [birth year]  03/07/2018  [id number]      Inguinal Hernia Operative Note (Full Operative Note)      Procedure:Repair  left Inguinal Hernia Incarcerated  [phone] with polypropylene mesh, open approach.    Pre-operative Diagnosis: left Inguinal hernia, Obstructed, non-recurrent, No gangreen    Post-operative Diagnosis: left Inguinal hernia, Obstructed, non-recurrent, No gangreen    Surgeon: Alexander Montgomery. Merritt Jr. MD    Assistant:    Circulator: Alexander Merritt  Scrub Person: Alexander Merritt  Dr. Shanon Merritt Merritt    Indications:    62 yo male with a slowly enlarging hernia that has become larger and mostly "stuck" recently.  Today he had abdominal pain and left groin pain and it would not reduce at all.  Some nausea.  He had some mild abdominal tenderness and a very tender left incarcerated inguinal hernia.    Referred by: ER                Anesthesia:General with local     EBL: 10 cc    Findings:    Large hernia sac, indirect.  Bowel in the LLQ appeared normal.  Minimal peritoneal fluid (not bloody).    Specimens: None     Other, Additional(antibiotics, Betablocker, SCD): SCD.  Ancef 1 gm IV.    Procedure Details     The patient was placed in a supine position. Next,after induction of general anesthesia (and using marcaine for local), he was prepped and draped in usual sterile fashion.     A standard left  groin incision was made within relaxed skin tension lines and carried down to the external oblique, which was opened down the external ring. The spermatic cord was mobilized.  A indirect hernia was found.    The hernia sac was dissected just distal to the internal ring and was encircled.  It was transected between hemostats.  The distal sac which extended to the distal scrotum was left in situ and the edges cauterized and hemostasis assured.  The proximal sac was dissected and debrided to the internal ring and then closed with a running 3-0 vicryl and reduced.A medium Bard plug was placed and the leaves secured with interrupted sutures of 0-Vicryl.  The Bard patch was secured with a few interrupted 2-O prolene sutures to the inguinal floor. The spermatic cord was placed in its anatomic position and then the external oblique closed with running 2-0 Vicryl. Scarpa's fascia was closed with interrupted 3-0 Vicryl and skin was closed with running subcuticular 4-0 Vicryl.    Note that hemostasis was assured and sponge and needle counts were correct.    He tolerated the procedure well.     Alexander Canard, MD        Observation appropriate.    Alexander Merritt BSN Merritt  Utilization Review Nurse  [facility]  Phone: [phone]  Fax: [phone]          VITALS: BP 115/59   Pulse 72   Temp 97.4 F (36.3 C) (Tympanic)   Resp 16   Ht 1.753 m (5\' 9" )   Wt 81.6 kg (180 lb)   SpO2 95%   BMI 26.58 kg/m

## 2018-03-08 NOTE — Discharge Instr - AVS First Page (Signed)
Post operative hernia instructions:    Diet:   Advance as tolerated.    Activity:   Walking is encouraged, and walking up and down stairs is fine, just be careful.   Avoid lifting more than 15 lbs and equivalent straining.    Wound:   You may shower the day after surgery (removed the dressing) but avoid bathing and swimming until at least 2 weeks after surgery.   Keep the wound covered with a dressing during the first week and change as necessary.    Call your physician for a temperature equal or more than 101 deg F., redness spreading from the wound, drainage of pus from the wound, excessive swelling, nausea or vomiting or increasing discomfort.    If you have questions or problems contact your physician immediately. If you need immediate attention, call your physician, 911 and/or go to nearest emergency room.    Following sedation and/or anesthesia, your judgement, perception, and coordination are considered impaired.  Even though you may feel awake and alert, you are considered legally intoxicated.  Therefore, until the next morning:    Do not Drive.  Do not operate appliances or equipment that requires reaction time (e.g. Stove, electrical tools, machinery).  Do not sign legal documents or be involved in important decisions.  Do not smoke if alone.  Do not drink alcoholic beverages.  Go directly home and rest for several hours before resuming your routine activities.  It is highly recommended to have a responsible adult stay with you for the next 24 hours.

## 2018-03-08 NOTE — Discharge Instr - Diet (Signed)
Advance as tolerated.

## 2018-03-08 NOTE — Progress Notes (Signed)
03/08/18 1015   Patient Type   Within 30 Days of Previous Admission? No   Healthcare Decisions   Orientation/Decision Making Abilities of Patient Alert and Oriented x3, able to make decisions   Prior to admission   Prior level of function Independent with ADLs   Type of Residence Private residence   Living Arrangements Friends   Discharge Planning   Patient expects to be discharged to: Home   Mode of transportation: Private car (family member)   Consults/Providers   PT Evaluation Needed 2   OT Evalulation Needed 2   SLP Evaluation Needed 2   Important Message from Medicare Notice   Patient received 1st IMM Letter? n/a

## 2018-03-08 NOTE — Discharge Summary (Signed)
DISCHARGE NOTE    Date Time: 03/08/18 7:45 AM  Patient Name: Alexander Merritt, Alexander Merritt  DOB: 1956-07-23  Attending Physician: Doylene Canard, MD  Primary Care Physician: Christa See, MD  Note By: Doylene Canard, MD    Date of Admission: 03/07/2018  Date of Discharge: 03/08/2018  LOS: 0    Reason for Admission:   Left inguinal hernia [K40.90]     Discharge Dx:         Principal Problem:    Non-recurrent unilateral inguinal hernia with obstruction without gangrene  Active Problems:    Inguinal hernia of left side with obstruction  Resolved Problems:    * No resolved hospital problems. *        Consultations:     None    Procedures performed:   Procedure(s):  Repair Inguinal Hernia W/ Mesh LEFT [facility] Course:   Briefly, [name] is a 62 y.o. male who presented complaining of a persistent large left inguinal hernia now with local pain and some diffuse abdominal pain and nausea.  On exam he had a large left inguinal hernia that was tender and could not be fully reduced.  Mild abdominal tenderness.  He was taken to the OR and underwent mesh repair of the hernia.  Bowel in the area appeared healthy and there was only serous abdominal fluid present.  Post op he has done well.  Pain improving.  He can ambulate.  Tolerating po.  Exam at discharge:  NAD.  Abd soft, non-tender.  Left groin wound healing well with minimal swelling.  Condition at discharge: stable.       Discharge Medications:        Discharge Medication List      Taking    oxyCODONE-acetaminophen 5-325 MG per tablet  Dose:  1 tablet  Commonly known as:  PERCOCET  Take 1 tablet by mouth every 6 (six) hours as needed for Pain Only 6 tablets maximum per day     UNKNOWN TO PATIENT  Muscle relaxer prescribed 3 months ago            Radiology Performed:   CT ABDOMEN PELVIS W IV/ WO PO CONT    Notable Laboratory:         Discharge Instructions:   Diet: Advance to regular  Activity: No lifting or straining of more than 15  lb.          Follow-up: In one week, Dr. Faylene Million        Signed by: Doylene Canard, MD

## 2018-03-08 NOTE — Discharge Instr - Activity (Signed)
Walking is encouraged, and walking up and down stairs is fine, just be careful.              Avoid lifting more than 15 lbs and equivalent straining.

## 2018-03-15 ENCOUNTER — Ambulatory Visit (INDEPENDENT_AMBULATORY_CARE_PROVIDER_SITE_OTHER): Payer: Medicaid Other | Admitting: Surgery

## 2018-03-15 ENCOUNTER — Encounter (INDEPENDENT_AMBULATORY_CARE_PROVIDER_SITE_OTHER): Payer: Self-pay | Admitting: Surgery

## 2018-03-15 DIAGNOSIS — Z9889 Other specified postprocedural states: Secondary | ICD-10-CM | POA: Insufficient documentation

## 2018-03-15 NOTE — Progress Notes (Signed)
PROGRESS NOTE    Date Time: 03/15/2018 11:17 AM  Patient Name: Alexander Merritt  Attending Physician: No att. providers found  Primary Care Physician: Pcp, None, MD      History of Presenting Illness:   [name] is a 62 y.o. male comes in for follow up after a left inguinal hernia repair  on 03/07/2018 .  He reports no fever or chills.    Post-operative discomfort is mild and is peri-incisional.          Allergies:     Allergies   Allergen Reactions   . Sulfa Antibiotics Nausea And Vomiting       Medications:     Prior to Admission medications    Medication Sig Start Date End Date Taking? Authorizing Provider   Ascorbic Acid (VITAMIN C PO) Take by mouth daily   Yes [provider]   Multiple Vitamins-Minerals (CENTRUM SILVER PO) Take by mouth daily   Yes [provider]   oxyCODONE-acetaminophen (PERCOCET) 5-325 MG per tablet Take 1 tablet by mouth every 6 (six) hours as needed for Pain Only 6 tablets maximum per day 03/08/18   Mackensie Pilson, Enzo Montgomery, MD   UNKNOWN TO PATIENT Muscle relaxer prescribed 3 months ago  03/15/18  [provider]       Review of Systems:     He reports no scrotal pain or swelling, or dysuria.    Physical Exam:     Vitals:    03/15/18 1101   BP: 122/72   Pulse: 72   Resp: 12   Temp: 98.1 F (36.7 C)     74.8 kg (165 lb)   1.7 m (5' 6.93")  Body mass index is 25.9 kg/m.     He is resting comfortably and is in no discomfort or distress.    Abdomen is soft and the wound is healing well.        Assessment:     1. Post-operative state          Doing very well    Plan:   Will plan return to normal activity in 2 weeks and follow up as needed.    Signed by: Doylene Canard, MD

## 2020-02-19 ENCOUNTER — Ambulatory Visit (RURAL_HEALTH_CENTER): Payer: No Typology Code available for payment source | Admitting: Physician Assistant
